# Patient Record
Sex: Female | Born: 1965 | Race: White | Hispanic: No | Marital: Married | Smoking: Never smoker
Health system: Southern US, Community
[De-identification: ages and names within clinical notes are randomized; demographics above are authoritative.]

## PROBLEM LIST (undated history)

## (undated) DIAGNOSIS — S92309A Fracture of unspecified metatarsal bone(s), unspecified foot, initial encounter for closed fracture: Secondary | ICD-10-CM

## (undated) DIAGNOSIS — E785 Hyperlipidemia, unspecified: Secondary | ICD-10-CM

## (undated) HISTORY — DX: Fracture of unspecified metatarsal bone(s), unspecified foot, initial encounter for closed fracture: S92.309A

## (undated) HISTORY — DX: Hyperlipidemia, unspecified: E78.5

## (undated) HISTORY — PX: TONSILLECTOMY: SUR1361

---

## 1986-04-15 HISTORY — PX: OOPHORECTOMY: SHX86

## 1995-08-04 ENCOUNTER — Encounter: Payer: Self-pay | Admitting: Family Medicine

## 1995-08-04 LAB — CONVERTED CEMR LAB: Pap Smear: NORMAL

## 1996-07-09 ENCOUNTER — Encounter: Payer: Self-pay | Admitting: Family Medicine

## 1996-07-09 LAB — CONVERTED CEMR LAB: Pap Smear: NORMAL

## 1997-09-23 ENCOUNTER — Encounter: Payer: Self-pay | Admitting: Family Medicine

## 1998-05-17 ENCOUNTER — Encounter: Payer: Self-pay | Admitting: Family Medicine

## 1998-05-24 ENCOUNTER — Other Ambulatory Visit: Admission: RE | Admit: 1998-05-24 | Discharge: 1998-05-24 | Payer: Self-pay | Admitting: Family Medicine

## 1998-05-25 ENCOUNTER — Encounter: Payer: Self-pay | Admitting: Family Medicine

## 1998-05-25 LAB — CONVERTED CEMR LAB: Blood Glucose, Fasting: 86 mg/dL

## 1999-09-11 ENCOUNTER — Encounter: Payer: Self-pay | Admitting: Family Medicine

## 1999-09-11 ENCOUNTER — Other Ambulatory Visit: Admission: RE | Admit: 1999-09-11 | Discharge: 1999-09-11 | Payer: Self-pay | Admitting: Family Medicine

## 1999-09-11 LAB — CONVERTED CEMR LAB: Pap Smear: NORMAL

## 1999-09-13 ENCOUNTER — Encounter: Payer: Self-pay | Admitting: Family Medicine

## 2000-08-22 ENCOUNTER — Other Ambulatory Visit: Admission: RE | Admit: 2000-08-22 | Discharge: 2000-08-22 | Payer: Self-pay | Admitting: Family Medicine

## 2000-08-27 ENCOUNTER — Encounter: Payer: Self-pay | Admitting: Family Medicine

## 2000-09-01 ENCOUNTER — Encounter: Payer: Self-pay | Admitting: Family Medicine

## 2001-10-28 ENCOUNTER — Encounter: Payer: Self-pay | Admitting: Family Medicine

## 2001-11-02 ENCOUNTER — Encounter: Payer: Self-pay | Admitting: Family Medicine

## 2002-01-14 HISTORY — PX: CHOLECYSTECTOMY: SHX55

## 2002-11-03 ENCOUNTER — Encounter: Payer: Self-pay | Admitting: Family Medicine

## 2002-11-03 LAB — CONVERTED CEMR LAB
Blood Glucose, Fasting: 91 mg/dL
RBC count: 4.47 10*6/uL
WBC, blood: 5.1 10*3/uL

## 2002-11-04 ENCOUNTER — Encounter: Payer: Self-pay | Admitting: Family Medicine

## 2002-11-04 ENCOUNTER — Other Ambulatory Visit: Admission: RE | Admit: 2002-11-04 | Discharge: 2002-11-04 | Payer: Self-pay | Admitting: Family Medicine

## 2003-08-30 ENCOUNTER — Other Ambulatory Visit: Admission: RE | Admit: 2003-08-30 | Discharge: 2003-08-30 | Payer: Self-pay | Admitting: Family Medicine

## 2003-09-01 ENCOUNTER — Encounter: Payer: Self-pay | Admitting: Family Medicine

## 2003-09-01 LAB — CONVERTED CEMR LAB
Blood Glucose, Fasting: 92 mg/dL
TSH: 0.77 microintl units/mL

## 2003-09-02 ENCOUNTER — Encounter: Payer: Self-pay | Admitting: Family Medicine

## 2003-09-02 LAB — CONVERTED CEMR LAB: Pap Smear: NORMAL

## 2004-10-03 ENCOUNTER — Encounter: Payer: Self-pay | Admitting: Family Medicine

## 2004-10-05 ENCOUNTER — Encounter: Payer: Self-pay | Admitting: Family Medicine

## 2004-10-05 ENCOUNTER — Other Ambulatory Visit: Admission: RE | Admit: 2004-10-05 | Discharge: 2004-10-05 | Payer: Self-pay | Admitting: Family Medicine

## 2004-10-05 LAB — CONVERTED CEMR LAB: Pap Smear: NORMAL

## 2004-10-09 ENCOUNTER — Ambulatory Visit: Payer: Self-pay | Admitting: Family Medicine

## 2004-10-25 ENCOUNTER — Ambulatory Visit: Payer: Self-pay | Admitting: Family Medicine

## 2004-11-05 ENCOUNTER — Ambulatory Visit: Payer: Self-pay | Admitting: Family Medicine

## 2005-01-28 ENCOUNTER — Ambulatory Visit: Payer: Self-pay | Admitting: Family Medicine

## 2005-08-21 ENCOUNTER — Ambulatory Visit: Payer: Self-pay | Admitting: Family Medicine

## 2005-10-31 ENCOUNTER — Ambulatory Visit: Payer: Self-pay | Admitting: Family Medicine

## 2005-10-31 LAB — CONVERTED CEMR LAB: Blood Glucose, Fasting: 92 mg/dL

## 2005-11-05 ENCOUNTER — Ambulatory Visit: Payer: Self-pay | Admitting: Family Medicine

## 2005-11-05 ENCOUNTER — Encounter: Payer: Self-pay | Admitting: Family Medicine

## 2005-11-05 ENCOUNTER — Other Ambulatory Visit: Admission: RE | Admit: 2005-11-05 | Discharge: 2005-11-05 | Payer: Self-pay | Admitting: Family Medicine

## 2005-11-05 LAB — CONVERTED CEMR LAB: Pap Smear: NORMAL

## 2006-07-08 ENCOUNTER — Ambulatory Visit: Payer: Self-pay | Admitting: Family Medicine

## 2006-09-02 ENCOUNTER — Ambulatory Visit: Payer: Self-pay | Admitting: Family Medicine

## 2006-11-26 ENCOUNTER — Ambulatory Visit: Payer: Self-pay | Admitting: Family Medicine

## 2006-12-06 ENCOUNTER — Ambulatory Visit: Payer: Self-pay | Admitting: Internal Medicine

## 2006-12-12 ENCOUNTER — Ambulatory Visit: Payer: Self-pay | Admitting: Family Medicine

## 2006-12-12 LAB — CONVERTED CEMR LAB: Blood Glucose, Fasting: 86 mg/dL

## 2007-01-09 ENCOUNTER — Other Ambulatory Visit: Admission: RE | Admit: 2007-01-09 | Discharge: 2007-01-09 | Payer: Self-pay | Admitting: Family Medicine

## 2007-01-09 ENCOUNTER — Ambulatory Visit: Payer: Self-pay | Admitting: Family Medicine

## 2007-01-09 ENCOUNTER — Encounter: Payer: Self-pay | Admitting: Family Medicine

## 2007-01-09 LAB — CONVERTED CEMR LAB: Pap Smear: NORMAL

## 2007-01-14 ENCOUNTER — Ambulatory Visit: Payer: Self-pay | Admitting: Family Medicine

## 2007-02-09 ENCOUNTER — Ambulatory Visit: Payer: Self-pay | Admitting: Family Medicine

## 2007-02-09 LAB — CONVERTED CEMR LAB
Albumin: 3.9 g/dL (ref 3.5–5.2)
Bilirubin, Direct: 0.1 mg/dL (ref 0.0–0.3)
Total Bilirubin: 0.7 mg/dL (ref 0.3–1.2)
Total Protein: 7.2 g/dL (ref 6.0–8.3)

## 2007-03-10 ENCOUNTER — Ambulatory Visit: Payer: Self-pay | Admitting: Family Medicine

## 2007-12-29 ENCOUNTER — Encounter: Payer: Self-pay | Admitting: Family Medicine

## 2007-12-29 DIAGNOSIS — G43009 Migraine without aura, not intractable, without status migrainosus: Secondary | ICD-10-CM | POA: Insufficient documentation

## 2007-12-29 DIAGNOSIS — E785 Hyperlipidemia, unspecified: Secondary | ICD-10-CM | POA: Insufficient documentation

## 2007-12-29 DIAGNOSIS — J309 Allergic rhinitis, unspecified: Secondary | ICD-10-CM | POA: Insufficient documentation

## 2008-01-11 ENCOUNTER — Ambulatory Visit: Payer: Self-pay | Admitting: Family Medicine

## 2008-01-11 LAB — CONVERTED CEMR LAB
BUN: 11 mg/dL (ref 6–23)
Basophils Absolute: 0 10*3/uL (ref 0.0–0.1)
Cholesterol: 204 mg/dL (ref 0–200)
Creatinine, Ser: 0.8 mg/dL (ref 0.4–1.2)
Direct LDL: 111.5 mg/dL
Eosinophils Absolute: 0.1 10*3/uL (ref 0.0–0.6)
GFR calc non Af Amer: 84 mL/min
MCHC: 33.7 g/dL (ref 30.0–36.0)
MCV: 91.5 fL (ref 78.0–100.0)
Monocytes Absolute: 0.3 10*3/uL (ref 0.2–0.7)
Monocytes Relative: 6.6 % (ref 3.0–11.0)
Potassium: 4 meq/L (ref 3.5–5.1)
RBC: 4.15 M/uL (ref 3.87–5.11)
RDW: 11.7 % (ref 11.5–14.6)
Sodium: 137 meq/L (ref 135–145)
TSH: 0.55 microintl units/mL (ref 0.35–5.50)
Total CHOL/HDL Ratio: 2.5
Triglycerides: 50 mg/dL (ref 0–149)

## 2008-01-14 ENCOUNTER — Ambulatory Visit: Payer: Self-pay | Admitting: Family Medicine

## 2008-01-14 ENCOUNTER — Other Ambulatory Visit: Admission: RE | Admit: 2008-01-14 | Discharge: 2008-01-14 | Payer: Self-pay | Admitting: Family Medicine

## 2008-01-14 ENCOUNTER — Encounter: Payer: Self-pay | Admitting: Family Medicine

## 2008-01-18 ENCOUNTER — Encounter (INDEPENDENT_AMBULATORY_CARE_PROVIDER_SITE_OTHER): Payer: Self-pay | Admitting: *Deleted

## 2008-02-16 ENCOUNTER — Ambulatory Visit: Payer: Self-pay | Admitting: Family Medicine

## 2008-02-16 ENCOUNTER — Encounter: Payer: Self-pay | Admitting: Family Medicine

## 2008-02-17 ENCOUNTER — Encounter (INDEPENDENT_AMBULATORY_CARE_PROVIDER_SITE_OTHER): Payer: Self-pay | Admitting: *Deleted

## 2008-02-18 ENCOUNTER — Telehealth: Payer: Self-pay | Admitting: Family Medicine

## 2008-03-21 ENCOUNTER — Ambulatory Visit: Payer: Self-pay | Admitting: Family Medicine

## 2008-08-19 ENCOUNTER — Telehealth: Payer: Self-pay | Admitting: Family Medicine

## 2008-10-20 ENCOUNTER — Ambulatory Visit: Payer: Self-pay | Admitting: Family Medicine

## 2008-12-28 ENCOUNTER — Telehealth: Payer: Self-pay | Admitting: Family Medicine

## 2009-01-25 ENCOUNTER — Ambulatory Visit: Payer: Self-pay | Admitting: Family Medicine

## 2009-01-25 LAB — CONVERTED CEMR LAB
Albumin: 4.1 g/dL (ref 3.5–5.2)
Alkaline Phosphatase: 35 units/L — ABNORMAL LOW (ref 39–117)
BUN: 12 mg/dL (ref 6–23)
Basophils Relative: 0.2 % (ref 0.0–3.0)
Calcium: 9.6 mg/dL (ref 8.4–10.5)
Creatinine, Ser: 0.8 mg/dL (ref 0.4–1.2)
Eosinophils Absolute: 0.1 10*3/uL (ref 0.0–0.7)
Eosinophils Relative: 2.9 % (ref 0.0–5.0)
GFR calc Af Amer: 101 mL/min
GFR calc non Af Amer: 84 mL/min
Glucose, Bld: 92 mg/dL (ref 70–99)
HCT: 39.4 % (ref 36.0–46.0)
HDL: 93.9 mg/dL (ref >39.0)
Hemoglobin: 13.7 g/dL (ref 12.0–15.0)
MCV: 92.5 fL (ref 78.0–100.0)
Monocytes Absolute: 0.3 10*3/uL (ref 0.1–1.0)
Monocytes Relative: 5.5 % (ref 3.0–12.0)
Neutro Abs: 3 10*3/uL (ref 1.4–7.7)
Platelets: 258 10*3/uL (ref 150–400)
Potassium: 3.9 meq/L (ref 3.5–5.1)
RBC: 4.26 M/uL (ref 3.87–5.11)
TSH: 1.81 microintl units/mL (ref 0.35–5.50)
Total CHOL/HDL Ratio: 2.1
Total Protein: 7.3 g/dL (ref 6.0–8.3)
WBC: 5 10*3/uL (ref 4.5–10.5)

## 2009-01-31 ENCOUNTER — Encounter: Payer: Self-pay | Admitting: Family Medicine

## 2009-01-31 ENCOUNTER — Other Ambulatory Visit: Admission: RE | Admit: 2009-01-31 | Discharge: 2009-01-31 | Payer: Self-pay | Admitting: Family Medicine

## 2009-01-31 ENCOUNTER — Ambulatory Visit: Payer: Self-pay | Admitting: Family Medicine

## 2009-02-03 ENCOUNTER — Encounter (INDEPENDENT_AMBULATORY_CARE_PROVIDER_SITE_OTHER): Payer: Self-pay | Admitting: *Deleted

## 2009-03-01 ENCOUNTER — Ambulatory Visit: Payer: Self-pay | Admitting: Family Medicine

## 2009-03-01 ENCOUNTER — Encounter: Payer: Self-pay | Admitting: Family Medicine

## 2009-03-02 ENCOUNTER — Encounter (INDEPENDENT_AMBULATORY_CARE_PROVIDER_SITE_OTHER): Payer: Self-pay | Admitting: *Deleted

## 2009-04-24 ENCOUNTER — Ambulatory Visit: Payer: Self-pay | Admitting: Family Medicine

## 2010-01-26 ENCOUNTER — Ambulatory Visit: Payer: Self-pay | Admitting: Family Medicine

## 2010-01-27 LAB — CONVERTED CEMR LAB
Albumin: 4 g/dL (ref 3.5–5.2)
Basophils Absolute: 0 10*3/uL (ref 0.0–0.1)
Basophils Relative: 0.8 % (ref 0.0–3.0)
CO2: 26 meq/L (ref 19–32)
Chloride: 107 meq/L (ref 96–112)
Creatinine, Ser: 0.8 mg/dL (ref 0.4–1.2)
Eosinophils Absolute: 0.1 10*3/uL (ref 0.0–0.7)
Glucose, Bld: 87 mg/dL (ref 70–99)
HDL: 92.9 mg/dL (ref >39.00)
Hemoglobin: 12.7 g/dL (ref 12.0–15.0)
MCHC: 33.2 g/dL (ref 30.0–36.0)
MCV: 93.7 fL (ref 78.0–100.0)
Monocytes Absolute: 0.3 10*3/uL (ref 0.1–1.0)
Neutro Abs: 3.3 10*3/uL (ref 1.4–7.7)
Neutrophils Relative %: 64.1 % (ref 43.0–77.0)
RBC: 4.09 M/uL (ref 3.87–5.11)
RDW: 12.1 % (ref 11.5–14.6)
TSH: 1.34 microintl units/mL (ref 0.35–5.50)
Total CHOL/HDL Ratio: 2
Total Protein: 6.6 g/dL (ref 6.0–8.3)
Triglycerides: 56 mg/dL (ref 0.0–149.0)

## 2010-02-06 ENCOUNTER — Other Ambulatory Visit: Admission: RE | Admit: 2010-02-06 | Discharge: 2010-02-06 | Payer: Self-pay | Admitting: Family Medicine

## 2010-02-06 ENCOUNTER — Ambulatory Visit: Payer: Self-pay | Admitting: Family Medicine

## 2010-02-06 LAB — CONVERTED CEMR LAB: Pap Smear: NORMAL

## 2010-02-13 ENCOUNTER — Encounter (INDEPENDENT_AMBULATORY_CARE_PROVIDER_SITE_OTHER): Payer: Self-pay | Admitting: *Deleted

## 2010-02-13 LAB — CONVERTED CEMR LAB: Pap Smear: NEGATIVE

## 2010-03-05 ENCOUNTER — Telehealth: Payer: Self-pay | Admitting: Family Medicine

## 2010-03-08 ENCOUNTER — Ambulatory Visit: Payer: Self-pay | Admitting: Family Medicine

## 2010-03-08 ENCOUNTER — Encounter: Payer: Self-pay | Admitting: Family Medicine

## 2010-03-27 ENCOUNTER — Ambulatory Visit: Payer: Self-pay | Admitting: Family Medicine

## 2010-04-04 ENCOUNTER — Ambulatory Visit: Payer: Self-pay | Admitting: Family Medicine

## 2010-04-04 ENCOUNTER — Encounter: Payer: Self-pay | Admitting: Family Medicine

## 2010-04-04 LAB — HM MAMMOGRAPHY

## 2010-04-05 ENCOUNTER — Encounter (INDEPENDENT_AMBULATORY_CARE_PROVIDER_SITE_OTHER): Payer: Self-pay | Admitting: *Deleted

## 2010-04-30 ENCOUNTER — Ambulatory Visit: Payer: Self-pay | Admitting: Family Medicine

## 2010-07-23 ENCOUNTER — Encounter (INDEPENDENT_AMBULATORY_CARE_PROVIDER_SITE_OTHER): Payer: Self-pay | Admitting: *Deleted

## 2010-12-21 ENCOUNTER — Encounter: Payer: Self-pay | Admitting: Family Medicine

## 2010-12-21 ENCOUNTER — Ambulatory Visit
Admission: RE | Admit: 2010-12-21 | Discharge: 2010-12-21 | Payer: Self-pay | Source: Home / Self Care | Attending: Family Medicine | Admitting: Family Medicine

## 2010-12-21 LAB — CONVERTED CEMR LAB

## 2011-01-15 NOTE — Assessment & Plan Note (Signed)
Summary: TOE/CLE   Vital Signs:  Patient profile:   45 year old female Weight:      131.50 pounds Temp:     98.8 degrees F oral Pulse rate:   64 / minute Pulse rhythm:   regular BP sitting:   98 / 60  (left arm) Cuff size:   regular  Vitals Entered By: Sydell Axon LPN (March 27, 2010 12:36 PM) CC: ? infected big right toe, red and swollen   History of Present Illness: Pt here in followup from 3/24 when seen for right great toe inflammation felt to be infection. She was treated with holes to the nail to relieve pressure, and incisional drainage of the paronychial area. The discomfort dhe had was somewhat relieved and recurred slightly a week ago andf is now again resolved but the toe is still slightly swollen and the nail is echymotice proximally.  She also has swelling and idscharge of the right eye late Sat/early Sun with reasonable resiolution after cleansing the eye and starting an antihistamine.  Problems Prior to Update: 1)  Paronychia, Great Toe  (ICD-681.11) 2)  Pain in Soft Tissues of Limb  (ICD-729.5) 3)  Health Maintenance Exam  (ICD-V70.0) 4)  Other Screening Mammogram  (ICD-V76.12) 5)  Migraine, Classical  (ICD-346.00) 6)  Hyperlipidemia  (ICD-272.4) 7)  Allergic Rhinitis  (ICD-477.9)  Medications Prior to Update: 1)  Desogen 0.15-30 Mg-Mcg Tabs (Desogestrel-Ethinyl Estradiol) .... Take As Directed 2)  Zomig Zmt 5 Mg Tbdp (Zolmitriptan) .... As Directed At Onset of  Migraine 3)  Topamax 100 Mg Tabs (Topiramate) .Marland Kitchen.. 1 Tablet Each Am By Mouth 4)  Gabapentin 600 Mg Tabs (Gabapentin) .Marland Kitchen.. 1 Tablet At Bedtime By Mouth 5)  Multivitamins   Tabs (Multiple Vitamin) .... One Daily 6)  Calcium Carbonate-Vitamin D 600-400 Mg-Unit  Tabs (Calcium Carbonate-Vitamin D) .... One Daily 7)  Vitamin C 500 Mg  Tabs (Ascorbic Acid) .... One Twice Daily 8)  Fish Oil   Oil (Fish Oil) .... One Daily 9)  Stool Softener .... One Daily 10)  Levaquin 750 Mg Tabs (Levofloxacin) .Marland Kitchen.. 1 By Mouth  Daily  Allergies: 1)  ! Penicillin 2)  ! Keflex 3)  ! Sulfa 4)  ! Macrodantin 5)  ! Erythromycin  Physical Exam  General:  GEN: Well-developed,well-nourished,in no acute distress; alert,appropriate and cooperative throughout examination HEENT: Normocephalic and atraumatic without obvious abnormalities. No apparent alopecia or balding. Ears, externally no deformities PULM: Breathing comfortably in no respiratory distress EXT: No clubbing, cyanosis, or edema PSYCH: Normally interactive. Cooperative during the interview. Pleasant. Friendly and conversant. Not anxious or depressed appearing. Normal, full affect.  Head:  Normocephalic and atraumatic without obvious abnormalities. No apparent alopecia or balding. Eyes:  Conjunctiva reasonably clear bilaterally, min inflamm of palpebral conjunctiva dependently.  Ears:  External ear exam shows no significant lesions or deformities.  Otoscopic examination reveals clear canals, tympanic membranes are intact bilaterally without bulging, retraction, inflammation or discharge. Hearing is grossly normal bilaterally. Nose:  External nasal examination shows no deformity or inflammation. Nasal mucosa are pink and moist without lesions or exudates. Mouth:  Oral mucosa and oropharynx without lesions or exudates.  Teeth in good repair. Neck:  No deformities, masses, or tenderness noted. Lungs:  Normal respiratory effort, chest expands symmetrically. Lungs are clear to auscultation, no crackles or wheezes. Heart:  Normal rate and regular rhythm. S1 and S2 normal without gallop, murmur, click, rub or other extra sounds. Extremities:  No clubbing, cyanosis, edema, or deformity noted with normal  full range of motion of all joints.  Right gt toe with slight discoloration of the prox nailbed and two small holes in the prox nail. Paronychial area minimally tender and intact. Slight redness and swelling of the toe int he DIP area generally but nontender and not  warm. Skin:  Intact without suspicious lesions or rashes   Impression & Recommendations:  Problem # 1:  PARONYCHIA, GREAT TOE (ICD-681.11) Assessment Improved Think most of the abnormal appearance vs the left toe from past inflammation not yet resolved and don't think it will resolve for quite a while. Reassurance given. Warned her to have patience and soak frequently in hot water to stimulate circulation. RTC if redness, swelling or pain significantly worsens. The following medications were removed from the medication list:    Levaquin 750 Mg Tabs (Levofloxacin) .Marland Kitchen... 1 by mouth daily  Problem # 2:  CONJUNCTIVITIS NOS (ICD-372.30) Assessment: New Sxs c/w viral conjunctivitis. Discussed eye hygiene. May contyinue Antihistamine. RTC if worsens.  Complete Medication List: 1)  Desogen 0.15-30 Mg-mcg Tabs (Desogestrel-ethinyl estradiol) .... Take as directed 2)  Zomig Zmt 5 Mg Tbdp (Zolmitriptan) .... As directed at onset of  migraine 3)  Topamax 100 Mg Tabs (Topiramate) .Marland Kitchen.. 1 tablet each am by mouth 4)  Gabapentin 600 Mg Tabs (Gabapentin) .Marland Kitchen.. 1 tablet at bedtime by mouth 5)  Multivitamins Tabs (Multiple vitamin) .... One daily 6)  Calcium Carbonate-vitamin D 600-400 Mg-unit Tabs (Calcium carbonate-vitamin d) .... One daily 7)  Vitamin C 500 Mg Tabs (Ascorbic acid) .... One twice daily 8)  Fish Oil Oil (Fish oil) .... One daily 9)  Stool Softener  .... One daily  Patient Instructions: 1)  RTC if sxs worsen.  Current Allergies (reviewed today): ! PENICILLIN ! KEFLEX ! SULFA ! MACRODANTIN ! ERYTHROMYCIN

## 2011-01-15 NOTE — Letter (Signed)
Summary: Results Follow up Letter   at [facility]  [address] [place], [place] [address]   Phone: [phone]  Fax: [phone]    02/13/2010 MRN: [id number]  Margaret Brewer [address] [place], [place]  [address]  Dear Ms. Hoefle,  The following are the results of your recent test(s):  Test         Result    Pap Smear:        Normal __X___  Not Normal _____ Comments: Please repeat in one year. ______________________________________________________ Cholesterol: LDL(Bad cholesterol):         Your goal is less than:         HDL (Good cholesterol):       Your goal is more than: Comments:  ______________________________________________________ Mammogram:        Normal _____  Not Normal _____ Comments:  ___________________________________________________________________ Hemoccult:        Normal _____  Not normal _______ Comments:    _____________________________________________________________________ Other Tests:    We routinely do not discuss normal results over the telephone.  If you desire a copy of the results, or you have any questions about this information we can discuss them at your next office visit.   Sincerely,      Laurita Quint, MD

## 2011-01-15 NOTE — Miscellaneous (Signed)
Summary: Special Procedure Consent/[place] [facility]  Special Procedure Consent/[place] [facility]   Imported By: Lanelle Bal 03/13/2010 13:39:14  _____________________________________________________________________  External Attachment:    Type:   Image     Comment:   External Document

## 2011-01-15 NOTE — Assessment & Plan Note (Signed)
Summary: CPX/CLE   Vital Signs:  Patient profile:   45 year old female Height:      62.5 inches Weight:      133 pounds BMI:     24.02 Temp:     98.6 degrees F oral Pulse rate:   72 / minute Pulse rhythm:   regular BP sitting:   100 / 70  (left arm) Cuff size:   regular  Vitals Entered By: Sydell Axon LPN (February 06, 2010 9:17 AM) CC: 30 Minute checkup, needs pap   History of Present Illness: Pt here for Comp Exam, doing well without complaint.  Preventive Screening-Counseling & Management  Alcohol-Tobacco     Alcohol drinks/day: 1     Alcohol type: wine     Smoking Status: never     Passive Smoke Exposure: no  Caffeine-Diet-Exercise     Caffeine use/day: 2     Does Patient Exercise: yes     Type of exercise: runs     Times/week: 5  Problems Prior to Update: 1)  Health Maintenance Exam  (ICD-V70.0) 2)  Other Screening Mammogram  (ICD-V76.12) 3)  Migraine, Classical  (ICD-346.00) 4)  Hyperlipidemia  (ICD-272.4) 5)  Allergic Rhinitis  (ICD-477.9)  Medications Prior to Update: 1)  Desogen 0.15-30 Mg-Mcg Tabs (Desogestrel-Ethinyl Estradiol) .... Take As Directed 2)  Zomig Zmt 5 Mg Tbdp (Zolmitriptan) .... As Directed At Onset of  Migraine 3)  Topamax 100 Mg Tabs (Topiramate) .Marland Kitchen.. 1 Tablet Each Am By Mouth 4)  Gabapentin 600 Mg Tabs (Gabapentin) .Marland Kitchen.. 1 Tablet At Bedtime By Mouth 5)  Multivitamins   Tabs (Multiple Vitamin) .... One Daily 6)  Calcium Carbonate-Vitamin D 600-400 Mg-Unit  Tabs (Calcium Carbonate-Vitamin D) .... One Daily 7)  Vitamin C 500 Mg  Tabs (Ascorbic Acid) .... One Twice Daily 8)  Fish Oil   Oil (Fish Oil) .... One Daily 9)  Stool Softener .... One Daily 10)  Tussionex Pennkinetic Er 8-10 Mg/4ml Lqcr (Chlorpheniramine-Hydrocodone) .Marland Kitchen.. 1 Tsp At Bedtime As Needed Cough  Allergies: 1)  ! Penicillin 2)  ! Keflex 3)  ! Sulfa 4)  ! Macrodantin 5)  ! Erythromycin  Past History:  Past Medical History: Last updated: 12/29/2007 Allergic  rhinitis:(09/1997) Hyperlipidemia:(05/1998)  Past Surgical History: Last updated: 01/14/2008 TONSILLECTOMY AS CHILD OOPHORECTOMY FOR OVARIAN CYST 5/87 LAPAROSCOPIC CHOLEYCYST(DR. 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 Eyes:  Denies blurring, discharge, double vision, eye irritation, and eye pain. ENT:  Denies decreased hearing, ear discharge, earache, and ringing in ears. CV:  Denies chest pain or discomfort, fainting, fatigue, palpitations, shortness of  breath with exertion, swelling of feet, and swelling of hands. Resp:  Denies chest pain with inspiration, cough, shortness of breath, and wheezing. GI:  Denies abdominal pain, bloody stools, change in bowel habits, constipation, dark tarry stools, diarrhea, indigestion, loss of appetite, nausea, vomiting, vomiting blood, and yellowish skin color. GU:  Denies discharge, dysuria, nocturia, and urinary frequency. MS:  Denies  joint pain, low back pain, muscle aches, cramps, muscle weakness, and stiffness. Derm:  Denies dryness, itching, and rash. Neuro:  Denies memory loss, numbness, poor balance, tingling, and tremors.  Physical Exam  General:  alert, well-developed, well-nourished, and well-hydrated.   Head:  Normocephalic and atraumatic without obvious abnormalities. No apparent alopecia or balding. Eyes:  Conjunctiva clear bilaterally.  Ears:  External ear exam shows no significant lesions or deformities.  Otoscopic examination reveals clear canals, tympanic membranes are intact bilaterally without bulging, retraction, inflammation or discharge. Hearing is grossly normal bilaterally. Nose:  External nasal examination shows no deformity or inflammation. Nasal mucosa are pink and moist without lesions or exudates. Mouth:  Oral mucosa and oropharynx without lesions or exudates.  Teeth in good repair. Neck:  No deformities, masses, or tenderness noted. Chest Wall:  No deformities, masses, or tenderness noted. Breasts:  No mass, nodules, thickening, tenderness, bulging, retraction, inflamation, nipple discharge or skin changes noted.   Lungs:  Normal respiratory effort, chest expands symmetrically. Lungs are clear to auscultation, no crackles or wheezes. Heart:  Normal rate and regular rhythm. S1 and S2 normal without gallop, murmur, click, rub or other extra sounds. Abdomen:  Bowel sounds positive,abdomen soft and non-tender without masses, organomegaly or hernias noted. Rectal:  No external abnormalities noted. Normal sphincter tone. No rectal masses or tenderness. G neg. Genitalia:  Pelvic Exam:        External: normal female genitalia without lesions or masses        Vagina: normal without lesions or masses        Cervix: normal without lesions or masses        Adnexa: normal bimanual exam without masses or fullness        Uterus: normal by palpation        Pap smear: performed Msk:  No deformity or scoliosis  noted of thoracic or lumbar spine.   Pulses:  R and L carotid,radial,femoral,dorsalis pedis and posterior tibial pulses are full and equal bilaterally Extremities:  No clubbing, cyanosis, edema, or deformity noted with normal full range of motion of all joints.   Neurologic:  No cranial nerve deficits noted. Station and gait are normal. Plantar reflexes are down-going bilaterally. DTRs are symmetrical throughout. Sensory, motor and coordinative functions appear intact. Skin:  Intact without suspicious lesions or rashes Cervical Nodes:  shotty tonsilar nodes, no anterior cervical adenopathy and no posterior cervical adenopathy.   Axillary Nodes:  No palpable lymphadenopathy Inguinal Nodes:  No significant adenopathy Psych:  normally interactive and good eye contact.     Impression & Recommendations:  Problem # 1:  HEALTH MAINTENANCE EXAM (ICD-V70.0) Assessment Comment Only Desogen sent in to [facility].  Problem # 2:  MIGRAINE, CLASSICAL (ICD-346.00) Assessment: Unchanged Stable. Cont meds as needed. Her updated medication list for this problem includes:    Zomig Zmt 5 Mg Tbdp (Zolmitriptan) .Marland Kitchen... As directed at onset of  migraine  Problem # 3:  HYPERLIPIDEMIA (ICD-272.4) Assessment: Unchanged Good control. Cont lifestyle and intake. Labs Reviewed: SGOT: 21 (01/26/2010)   SGPT: 20 (01/26/2010)   HDL:92.90 (01/26/2010), 93.9 (01/25/2009)  LDL:93 (01/26/2010), 79 (  01/25/2009)  Chol:197 (01/26/2010), 196 (01/25/2009)  Trig:56.0 (01/26/2010), 114 (01/25/2009)  Problem # 4:  ALLERGIC RHINITIS (ICD-477.9) Assessment: Unchanged Stable.  Complete Medication List: 1)  Desogen 0.15-30 Mg-mcg Tabs (Desogestrel-ethinyl estradiol) .... Take as directed 2)  Zomig Zmt 5 Mg Tbdp (Zolmitriptan) .... As directed at onset of  migraine 3)  Topamax 100 Mg Tabs (Topiramate) .Marland Kitchen.. 1 tablet each am by mouth 4)  Gabapentin 600 Mg Tabs (Gabapentin) .Marland Kitchen.. 1 tablet at bedtime by mouth 5)  Multivitamins Tabs  (Multiple vitamin) .... One daily 6)  Calcium Carbonate-vitamin D 600-400 Mg-unit Tabs (Calcium carbonate-vitamin d) .... One daily 7)  Vitamin C 500 Mg Tabs (Ascorbic acid) .... One twice daily 8)  Fish Oil Oil (Fish oil) .... One daily 9)  Stool Softener  .... One daily  Patient Instructions: 1)  RTC as needed. Prescriptions: DESOGEN 0.15-30 MG-MCG TABS (DESOGESTREL-ETHINYL ESTRADIOL) TAKE AS DIRECTED  #3 packs x 4   Entered and Authorized by:   Shaune Leeks MD   Signed by:   Shaune Leeks MD on 02/06/2010   Method used:   Electronically to        [facility]* (mail-order)             ,          Ph: [phone]       Fax: [phone]   RxID:   [id number]   Current Allergies (reviewed today): ! PENICILLIN ! KEFLEX ! SULFA ! MACRODANTIN ! ERYTHROMYCIN

## 2011-01-15 NOTE — Progress Notes (Signed)
Summary: needs order for mammogram  Phone Note Call from Patient Call back at Work Phone [phone]   Caller: Patient Summary of Call: Pt needs order for mammogram.  She goes [place].  Prefers early morning appt-  8:30- 8:45.  Knows that you are out till next week. Initial call taken by: Lowella Petties CMA,  March 05, 2010 9:32 AM  Follow-up for Phone Call        [facility] set up at [facility] on 04/04/2010 at 8:30, order faxed and mailed. Follow-up by: Carlton Adam,  March 08, 2010 8:45 AM

## 2011-01-15 NOTE — Assessment & Plan Note (Signed)
Summary: RIGHT FOOT INJURY-NOT SURE WHEN IT HAPPEN  CYD   Vital Signs:  Patient profile:   45 year old female Height:      62.5 inches Weight:      131.0 pounds BMI:     23.66 Temp:     98.4 degrees F oral Pulse rate:   72 / minute Pulse rhythm:   regular BP sitting:   90 / 60  (left arm) Cuff size:   regular  Vitals Entered By: Benny Lennert CMA Duncan Dull) (March 08, 2010 10:02 AM)   History of Present Illness: Chief complaint Right great toe injury not sure when this happened  45 year old:  the patient has some mild redness  and swelling in the medial aspect of the distal toe  adjacent to the nail bed. There is also some discoloration in the proximal nailbed medially.  There is tenderness to palpation and around the nailbed and  medial toe.  The patient has no history of trauma. She is able walk with a mildly antalgic gait.  There is no history of significant traumatic fracture, operative interventions in the affected area. She has no known history of gout.  Allergies: 1)  ! Penicillin 2)  ! Keflex 3)  ! Sulfa 4)  ! Macrodantin 5)  ! Erythromycin  Past History:  Past medical, surgical, family and social histories (including risk factors) reviewed, and no changes noted (except as noted below).  Past Medical History: Reviewed history from 12/29/2007 and no changes required. Allergic rhinitis:(09/1997) Hyperlipidemia:(05/1998)  Past Surgical History: Reviewed history from 01/14/2008 and no changes required. TONSILLECTOMY AS CHILD OOPHORECTOMY FOR OVARIAN CYST 5/87 LAPAROSCOPIC CHOLEYCYST(DR. Evette Cristal) 01/14/2002  Family History: Reviewed history from 02/06/2010 and no changes required. Father:ESTRANGED LEFT WHEN SHE WAS 18 MONTHS OLD  Mother: ALIVE 90  OSTEOPROSIS ; ELEVATED CHOLESTEROL; RAD Siblings: NO SIBLINGS CV: + CAD GF HBP: +GF DM: NEGATIVE CANCER: + GM OTHER: + STROKE GF X 2 COLON CANCER: DEPRESSION: GOUT /ARTHRITIS: ETOH/DRUG ABUSE  Social  History: Reviewed history from 12/29/2007 and no changes required. 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 No fever, chills, sweats. No other systemic symptoms. Mostly skeletal pain as noted. There is no pain with motion at the MTP joint.  Physical Exam  General:  GEN: Well-developed,well-nourished,in no acute distress; alert,appropriate and cooperative throughout examination HEENT: Normocephalic and atraumatic without obvious abnormalities. No apparent alopecia or balding. Ears, externally no deformities PULM: Breathing comfortably in no respiratory distress EXT: No clubbing, cyanosis, or edema PSYCH: Normally interactive. Cooperative during the interview. Pleasant. Friendly and conversant. Not anxious or depressed appearing. Normal, full affect.  Msk:  right foot: Nontender throughout the midfoot and hindfoot including all ankle bones.  Nontender throughout all metatarsals and metatarsal  phalangeal joints.   There is some redness and mild swelling  medially and distally on the first  digit.  There is corresponding discoloration and  bruising/brownish appearance to the  proximal nailbed medially. This area does correspond to the point of maximal tenderness.   Impression & Recommendations:  Problem # 1:  PARONYCHIA, GREAT TOE (ICD-681.11) Assessment New  examination most consistent with cellulitis and paronychia. Patient is allergic to  multiple medications, which is difficult in this case.  I advised her to soak her toe in warm  water multiple times a day, and complete antibiotic course.  If she is not improved  by next week, followup is indicated, and I would likely have her see podiatry. I do not think this is gout or other acute musculoskeletal issue.  Incision and drainage,  paronychia,  right great toe patient was verbally consented and  written consent was obtained.  Risks, benefits were discussed. Patient was prepped with alcohol x3, and subsequently a ring block with 5 cc of lidocaine 2% and 5 cc of Marcaine 0.5% was used to achieve excellent anesthesia. area was prepped with Betadine x3.  Then a #11 scalpel was used to incise at the  medial base of the nail bed.. No pus was expressed. It did appear that there was some pus underneath the nailbed in and around the medial base.. subsequently a paper clip was heated  until red hot  to attempt to open the  nail base  and free any trapped pus, however  once this was undertaken, there was no free flowing  pus underneath the nailbed.  Her updated medication list for this problem includes:    Levaquin 750 Mg Tabs (Levofloxacin) .Marland Kitchen... 1 by mouth daily  Orders: I&D Abscess, Simple / Single (10060)  Problem # 2:  PAIN IN SOFT TISSUES OF LIMB (ICD-729.5) x-rays were taken of the great toe Indication pain Findings: There is no evidence for occult fracture dislocation or other significant  abnormality.  Orders: Radiology other (Radiology Other)  Complete Medication List: 1)  Desogen 0.15-30 Mg-mcg Tabs (Desogestrel-ethinyl estradiol) .... Take as directed 2)  Zomig Zmt 5 Mg Tbdp (Zolmitriptan) .... As directed at onset of  migraine 3)  Topamax 100 Mg Tabs (Topiramate) .Marland Kitchen.. 1 tablet each am by mouth 4)  Gabapentin 600 Mg Tabs (Gabapentin) .Marland Kitchen.. 1 tablet at bedtime by mouth 5)  Multivitamins Tabs (Multiple vitamin) .... One daily 6)  Calcium Carbonate-vitamin D 600-400 Mg-unit Tabs (Calcium carbonate-vitamin d) .... One daily 7)  Vitamin C 500 Mg Tabs (Ascorbic acid) .... One twice daily 8)  Fish Oil Oil (Fish oil) .... One daily 9)  Stool Softener  .... One daily 10)  Levaquin 750 Mg Tabs (Levofloxacin) .Marland Kitchen.. 1 by mouth daily Prescriptions: LEVAQUIN 750 MG TABS (LEVOFLOXACIN) 1 by mouth daily  #5 x 0   Entered and Authorized by:   Hannah Beat MD   Signed by:   Hannah Beat MD  on 03/08/2010   Method used:   Electronically to        [facility]* (retail)       [address]       [place], [place]  [address]       Ph: [phone]       Fax: [phone]   RxID:   [id number]   Current Allergies (reviewed today): ! PENICILLIN ! KEFLEX ! SULFA ! MACRODANTIN ! ERYTHROMYCIN

## 2011-01-15 NOTE — Letter (Signed)
Summary: [name] letter  [place] [facility]  [address] [place], [place] [address]   Phone: [phone]  Fax: [phone]       07/23/2010 MRN: [id number]  [name] [address] [place], [place]  [address]  Dear Ms. [facility] [place], and  announce the retirement of Arta Silence, M.D., from full-time practice at the [facility] office effective June 14, 2010 and his plans of returning part-time.  It is important to Dr. Hetty Ely and to our practice that you understand that [facility] has seven physicians in our office for your health care needs.  We will continue to offer the same exceptional care that you have today.    Dr. Hetty Ely has spoken to many of you about his plans for retirement and returning part-time in the fall.   We will continue to work with you through the transition to schedule appointments for you in the office and meet the high standards that [facility] is committed to.   Again, it is with great pleasure that we share the news that Dr. Hetty Ely will return to [facility] in October of 2011 with a reduced schedule.    If you have any questions, or would like to request an appointment with one of our physicians, please call us at [phone] and press the option for Scheduling an appointment.  We take pleasure in providing you with excellent patient care and look forward to seeing you at your next office visit.  Our [facility] Physicians are:  Tillman Abide, M.D. Laurita Quint, M.D. Roxy Manns, M.D. Kerby Nora, M.D. Hannah Beat, M.D. Ruthe Mannan, M.D. We proudly welcomed Raechel Ache, M.D. and Eustaquio Boyden, M.D. to the practice in July/August 2011.  Sincerely,  [facility]

## 2011-01-15 NOTE — Assessment & Plan Note (Signed)
Summary: STILL HAVING PROBLEMS W/BIG TOE ON RIGHT FOOT   Vital Signs:  Patient profile:   45 year old female Weight:      131.75 pounds Temp:     98.6 degrees F oral Pulse rate:   64 / minute Pulse rhythm:   regular BP sitting:   108 / 70  (left arm) Cuff size:   regular  Vitals Entered By: Sydell Axon LPN (Apr 30, 2010 4:00 PM) CC: Still having problems with big toe on right foot   History of Present Illness: Pt here again to discuss her right great toenail which, when she reomoved the polish a week ago, now had a white sepinginous irregular but coalescent area under a significant area of the nail. It is also quite loose distally. She was concerned that something was progressing or that she had developed a fungal  infection.  Problems Prior to Update: 1)  Paronychia, Great Toe  (ICD-681.11) 2)  Pain in Soft Tissues of Limb  (ICD-729.5) 3)  Health Maintenance Exam  (ICD-V70.0) 4)  Other Screening Mammogram  (ICD-V76.12) 5)  Migraine, Classical  (ICD-346.00) 6)  Hyperlipidemia  (ICD-272.4) 7)  Allergic Rhinitis  (ICD-477.9)  Medications Prior to Update: 1)  Desogen 0.15-30 Mg-Mcg Tabs (Desogestrel-Ethinyl Estradiol) .... Take As Directed 2)  Zomig Zmt 5 Mg Tbdp (Zolmitriptan) .... As Directed At Onset of  Migraine 3)  Topamax 100 Mg Tabs (Topiramate) .Marland Kitchen.. 1 Tablet Each Am By Mouth 4)  Gabapentin 600 Mg Tabs (Gabapentin) .Marland Kitchen.. 1 Tablet At Bedtime By Mouth 5)  Multivitamins   Tabs (Multiple Vitamin) .... One Daily 6)  Calcium Carbonate-Vitamin D 600-400 Mg-Unit  Tabs (Calcium Carbonate-Vitamin D) .... One Daily 7)  Vitamin C 500 Mg  Tabs (Ascorbic Acid) .... One Twice Daily 8)  Fish Oil   Oil (Fish Oil) .... One Daily 9)  Stool Softener .... One Daily  Allergies: 1)  ! Penicillin 2)  ! Keflex 3)  ! Sulfa 4)  ! Macrodantin 5)  ! Erythromycin  Physical Exam  Extremities:  R great nail with unattached area under the nail from the naibed which is irregular in shape. Ths nail  itself has thicker appearance slightly distally to the paranychia and the nail at the paranychia itself is adherent and looks nml.   Impression & Recommendations:  Problem # 1:  PARONYCHIA, GREAT TOE (ICD-681.11) Assessment Improved  Infection is akll gone and the inflammation has resolved leaving the potential space under the nail making it now nonadherent and thus white. Explained all to the pt. Will take a year to grow out, if not caught and pulled up. Response is nml from traumatized nail...patience is a virtue.  Complete Medication List: 1)  Desogen 0.15-30 Mg-mcg Tabs (Desogestrel-ethinyl estradiol) .... Take as directed 2)  Zomig Zmt 5 Mg Tbdp (Zolmitriptan) .... As directed at onset of  migraine 3)  Topamax 100 Mg Tabs (Topiramate) .Marland Kitchen.. 1 tablet each am by mouth 4)  Gabapentin 600 Mg Tabs (Gabapentin) .Marland Kitchen.. 1 tablet at bedtime by mouth 5)  Multivitamins Tabs (Multiple vitamin) .... One daily 6)  Calcium Carbonate-vitamin D 600-400 Mg-unit Tabs (Calcium carbonate-vitamin d) .... One daily 7)  Vitamin C 500 Mg Tabs (Ascorbic acid) .... One twice daily 8)  Fish Oil Oil (Fish oil) .... One daily 9)  Stool Softener  .... One daily  Patient Instructions: 1)  Give one year for nail to regenrate normally.  Current Allergies (reviewed today): ! PENICILLIN ! KEFLEX ! SULFA ! MACRODANTIN !  ERYTHROMYCIN

## 2011-01-15 NOTE — Letter (Signed)
Summary: Results Follow up Letter  [place] at [facility]  [address] [place], [place] [address]   Phone: [phone]  Fax: [phone]    04/05/2010 MRN: [id number]  Margaret Brewer [address] [place], [place]  [address]  Dear Ms. Flavell,  The following are the results of your recent test(s):  Test         Result    Pap Smear:        Normal _____  Not Normal _____ Comments: ______________________________________________________ Cholesterol: LDL(Bad cholesterol):         Your goal is less than:         HDL (Good cholesterol):       Your goal is more than: Comments:  ______________________________________________________ Mammogram:        Normal __X___  Not Normal _____ Comments:Please repeat in one year.  ___________________________________________________________________ Hemoccult:        Normal _____  Not normal _______ Comments:    _____________________________________________________________________ Other Tests:    We routinely do not discuss normal results over the telephone.  If you desire a copy of the results, or you have any questions about this information we can discuss them at your next office visit.   Sincerely,     Laurita Quint, MD

## 2011-01-17 NOTE — Assessment & Plan Note (Signed)
Summary: UTI / LFW   Vital Signs:  Patient profile:   45 year old female Height:      62.5 inches Weight:      132.25 pounds BMI:     23.89 Temp:     99.0 degrees F oral Pulse rate:   80 / minute Pulse rhythm:   regular BP sitting:   122 / 74  (left arm) Cuff size:   regular  Vitals Entered By: Selena Batten Dance CMA (AAMA) (December 21, 2010 2:25 PM) CC: ? UTI Comments Pressure, frequency and urgency   History of Present Illness: CC: ? bladder infection  3d h/o incomplete emptying as well as bladder pressure.  increased frequency and urgency.    No dysuria.  No fevers/chills, n/v, back pain.  had utis in past, years ago.  drinking water and cranberry juice.  Current Medications (verified): 1)  Desogen 0.15-30 Mg-Mcg Tabs (Desogestrel-Ethinyl Estradiol) .... Take As Directed 2)  Zomig Zmt 5 Mg Tbdp (Zolmitriptan) .... As Directed At Onset of  Migraine 3)  Topamax 100 Mg Tabs (Topiramate) .Marland Kitchen.. 1 Tablet Each Am By Mouth 4)  Gabapentin 600 Mg Tabs (Gabapentin) .Marland Kitchen.. 1 Tablet At Bedtime By Mouth 5)  Multivitamins   Tabs (Multiple Vitamin) .... One Daily 6)  Calcium Carbonate-Vitamin D 600-400 Mg-Unit  Tabs (Calcium Carbonate-Vitamin D) .... One Daily 7)  Vitamin C 500 Mg  Tabs (Ascorbic Acid) .... One Twice Daily 8)  Fish Oil   Oil (Fish Oil) .... One Daily  Allergies: 1)  ! Penicillin 2)  ! Keflex 3)  ! Sulfa 4)  ! Macrodantin 5)  ! 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 Lungs are clear to auscultation, no crackles or wheezes. Heart:  Normal rate and regular rhythm. S1 and S2 normal without  gallop, murmur, click, rub or other extra sounds. Abdomen:  Bowel sounds positive,abdomen soft and non-tender without masses, organomegaly or hernias noted.  + suprapubic pressure.  no CVA tenderness   Impression & Recommendations:  Problem # 1:  ACUTE CYSTITIS (ICD-595.0)  minimal on micro.  treat with cipro 250 two times a day x 3 days.  sent culture given allergies to other abx ,ensure cipro ok.  Her updated medication list for this problem includes:    Cipro 250 Mg Tabs (Ciprofloxacin hcl) .Marland Kitchen... Take one twice daily for 3 days (use this #)  Orders: UA Dipstick W/ Micro (manual) ([address]) Specimen Handling ([address]) T-Culture, Urine ([id number])  Complete Medication List: 1)  Desogen 0.15-30 Mg-mcg Tabs (Desogestrel-ethinyl estradiol) .... Take as directed 2)  Zomig Zmt 5 Mg Tbdp (Zolmitriptan) .... As directed at onset of  migraine 3)  Topamax 100 Mg Tabs (Topiramate) .Marland Kitchen.. 1 tablet each am by mouth 4)  Gabapentin 600 Mg Tabs (Gabapentin) .Marland Kitchen.. 1 tablet at bedtime by mouth 5)  Multivitamins Tabs (Multiple vitamin) .... One daily 6)  Calcium Carbonate-vitamin D 600-400 Mg-unit Tabs (Calcium carbonate-vitamin d) .... One daily 7)  Vitamin C 500 Mg Tabs (Ascorbic acid) .... One twice daily 8)  Fish Oil Oil (Fish oil) .... One daily 9)  Cipro 250 Mg Tabs (Ciprofloxacin hcl) .... Take one twice daily for 3 days (  use this #)  Patient Instructions: 1)  looks like mild UTI.  treat with cipro 250mg  twice daily for 5 days. 2)  push fluids, plenty of rest. 3)  call clinic with questions or if not any better or any fevers/chills, nausea. 4)  good to see you today. Prescriptions: CIPRO 250 MG TABS (CIPROFLOXACIN HCL) take one twice daily for 3 days (use this #)  #6 x 0   Entered and Authorized by:   Eustaquio Boyden  MD   Signed by:   Eustaquio Boyden  MD on 12/21/2010   Method used:   Electronically to        [facility]* (retail)       [address]       [place], [place]   [address]       Ph: [phone]       Fax: [phone]   RxID:   [id number] CIPRO 250 MG TABS (CIPROFLOXACIN HCL) take one twice daily for 5 days  #10 x 0   Entered and Authorized by:   Eustaquio Boyden  MD   Signed by:   Eustaquio Boyden  MD on 12/21/2010   Method used:   Electronically to        [facility]* (retail)       [address]       [place], [place]  [address]       Ph: [phone]       Fax: [phone]   RxID:   [id number]    Orders Added: 1)  Est. Patient Level III [[address]] 2)  UA Dipstick W/ Micro (manual) [[address]] 3)  Specimen Handling [[address]] 4)  T-Culture, Urine [[id number]]    Current Allergies (reviewed today): ! PENICILLIN ! KEFLEX ! SULFA ! MACRODANTIN ! ERYTHROMYCIN  Laboratory Results   Urine Tests    Urine Microscopic WBC/HPF: 1-5 RBC/HPF: no Bacteria/HPF: 1+ rods Mucous/HPF: no Epithelial/HPF: rare Crystals/HPF: no Casts/LPF: no Yeast/HPF: no    Comments: read by .....................Eustaquio Boyden  MD  December 21, 2010 2:39 PM  UCx sent    Appended Document: UTI / LFW    Clinical Lists Changes  Observations: Added new observation of PH URINE: 7.0  (12/21/2010 14:49) Added new observation of SPEC GR URIN: 1.010  (12/21/2010 14:49) Added new observation of APPEARANCE U: Hazy  (12/21/2010 14:49) Added new observation of UA COLOR: yellow  (12/21/2010 14:49) Added new observation of WBC DIPSTK U: trace  (12/21/2010 14:49) Added new observation of NITRITE URN: negative  (12/21/2010 14:49) Added new observation of UROBILINOGEN: 0.2  (12/21/2010 14:49) Added new observation of PROTEIN, URN: trace  (12/21/2010 14:49) Added new observation of BLOOD UR DIP: negative  (12/21/2010 14:49) Added new observation of KETONES URN: negative  (12/21/2010 14:49) Added new observation of BILIRUBIN UR: negative  (12/21/2010 14:49) Added new observation of GLUCOSE, URN: negative  (12/21/2010 14:49)      Laboratory Results    Urine Tests  Date/Time Received: December 21, 2010  Date/Time Reported: December 21, 2010   Routine Urinalysis   Color: yellow Appearance: Hazy Glucose: negative   (Normal Range: Negative) Bilirubin: negative   (Normal Range: Negative) Ketone: negative   (Normal Range: Negative) Spec. Gravity: 1.010   (Normal Range: 1.003-1.035) Blood: negative   (Normal Range: Negative) pH: 7.0   (Normal Range: 5.0-8.0) Protein: trace   (Normal Range: Negative) Urobilinogen: 0.2   (Normal Range: 0-1) Nitrite: negative   (Normal Range: Negative) Leukocyte Esterace: trace   (Normal Range: Negative)

## 2011-02-04 ENCOUNTER — Encounter (INDEPENDENT_AMBULATORY_CARE_PROVIDER_SITE_OTHER): Payer: Self-pay | Admitting: *Deleted

## 2011-02-04 ENCOUNTER — Other Ambulatory Visit (INDEPENDENT_AMBULATORY_CARE_PROVIDER_SITE_OTHER): Payer: BC Managed Care – PPO

## 2011-02-04 ENCOUNTER — Encounter: Payer: Self-pay | Admitting: Family Medicine

## 2011-02-04 ENCOUNTER — Other Ambulatory Visit: Payer: Self-pay | Admitting: Family Medicine

## 2011-02-04 DIAGNOSIS — E785 Hyperlipidemia, unspecified: Secondary | ICD-10-CM

## 2011-02-04 DIAGNOSIS — M79609 Pain in unspecified limb: Secondary | ICD-10-CM

## 2011-02-04 DIAGNOSIS — Z0000 Encounter for general adult medical examination without abnormal findings: Secondary | ICD-10-CM

## 2011-02-04 LAB — BASIC METABOLIC PANEL
BUN: 15 mg/dL (ref 6–23)
Calcium: 9.8 mg/dL (ref 8.4–10.5)
GFR: 89.14 mL/min (ref >60.00)
Glucose, Bld: 81 mg/dL (ref 70–99)
Sodium: 138 mEq/L (ref 135–145)

## 2011-02-04 LAB — CBC WITH DIFFERENTIAL/PLATELET
Basophils Absolute: 0 10*3/uL (ref 0.0–0.1)
Basophils Relative: 0.6 % (ref 0.0–3.0)
Eosinophils Absolute: 0.1 10*3/uL (ref 0.0–0.7)
Lymphocytes Relative: 39 % (ref 12.0–46.0)
MCHC: 34.7 g/dL (ref 30.0–36.0)
MCV: 92.1 fl (ref 78.0–100.0)
Monocytes Absolute: 0.3 10*3/uL (ref 0.1–1.0)
Neutro Abs: 2.9 10*3/uL (ref 1.4–7.7)
Neutrophils Relative %: 53.1 % (ref 43.0–77.0)
RBC: 4.27 Mil/uL (ref 3.87–5.11)
RDW: 12.8 % (ref 11.5–14.6)

## 2011-02-04 LAB — LDL CHOLESTEROL, DIRECT: Direct LDL: 115 mg/dL

## 2011-02-04 LAB — LIPID PANEL
HDL: 104.3 mg/dL (ref >39.00)
Triglycerides: 71 mg/dL (ref 0.0–149.0)
VLDL: 14.2 mg/dL (ref 0.0–40.0)

## 2011-02-04 LAB — HEPATIC FUNCTION PANEL
Albumin: 4 g/dL (ref 3.5–5.2)
Total Bilirubin: 0.7 mg/dL (ref 0.3–1.2)

## 2011-02-04 LAB — MAGNESIUM: Magnesium: 1.9 mg/dL (ref 1.5–2.5)

## 2011-02-07 ENCOUNTER — Encounter: Payer: Self-pay | Admitting: Family Medicine

## 2011-02-07 ENCOUNTER — Encounter (INDEPENDENT_AMBULATORY_CARE_PROVIDER_SITE_OTHER): Payer: BC Managed Care – PPO | Admitting: Family Medicine

## 2011-02-07 ENCOUNTER — Other Ambulatory Visit (HOSPITAL_COMMUNITY)
Admission: RE | Admit: 2011-02-07 | Discharge: 2011-02-07 | Disposition: A | Discharge status: Home / Self Care | Payer: BC Managed Care – PPO | Source: Ambulatory Visit | Attending: Family Medicine | Admitting: Family Medicine

## 2011-02-07 ENCOUNTER — Other Ambulatory Visit: Payer: Self-pay | Admitting: Family Medicine

## 2011-02-07 DIAGNOSIS — Z0000 Encounter for general adult medical examination without abnormal findings: Secondary | ICD-10-CM

## 2011-02-07 DIAGNOSIS — Z124 Encounter for screening for malignant neoplasm of cervix: Secondary | ICD-10-CM | POA: Insufficient documentation

## 2011-02-12 NOTE — Assessment & Plan Note (Signed)
Summary: cpe CLE   Vital Signs:  Patient profile:   45 year old female Weight:      129.25 pounds Temp:     98.6 degrees F oral Pulse rate:   88 / minute Pulse rhythm:   regular BP sitting:   90 / 60  (left arm) Cuff size:   regular  Vitals Entered By: Sydell Axon LPN (February 07, 2011 8:35 AM) CC: 30 Minute checkup with pap   History of Present Illness: Pt here for Comp Exam. Her nail has just about grown out. She feels well withn no complaints except for migraines after her periods.   Preventive Screening-Counseling & Management  Alcohol-Tobacco     Alcohol drinks/day: 1     Alcohol type: wine     Smoking Status: never     Passive Smoke Exposure: no  Caffeine-Diet-Exercise     Caffeine use/day: 2     Does Patient Exercise: no     Type of exercise: runs     Times/week: 5  Problems Prior to Update: 1)  Paronychia, Great Toe  (ICD-681.11) 2)  Pain in Soft Tissues of Limb  (ICD-729.5) 3)  Health Maintenance Exam  (ICD-V70.0) 4)  Other Screening Mammogram  (ICD-V76.12) 5)  Migraine, Classical  (ICD-346.00) 6)  Hyperlipidemia  (ICD-272.4) 7)  Allergic Rhinitis  (ICD-477.9)  Medications Prior to Update: 1)  Desogen 0.15-30 Mg-Mcg Tabs (Desogestrel-Ethinyl Estradiol) .... Take As Directed 2)  Zomig Zmt 5 Mg Tbdp (Zolmitriptan) .... As Directed At Onset of  Migraine 3)  Topamax 100 Mg Tabs (Topiramate) .Marland Kitchen.. 1 Tablet Each Am By Mouth 4)  Gabapentin 600 Mg Tabs (Gabapentin) .Marland Kitchen.. 1 Tablet At Bedtime By Mouth 5)  Multivitamins   Tabs (Multiple Vitamin) .... One Daily 6)  Calcium Carbonate-Vitamin D 600-400 Mg-Unit  Tabs (Calcium Carbonate-Vitamin D) .... One Daily 7)  Vitamin C 500 Mg  Tabs (Ascorbic Acid) .... One Twice Daily 8)  Fish Oil   Oil (Fish Oil) .... One Daily 9)  Cipro 250 Mg Tabs (Ciprofloxacin Hcl) .... Take One Twice Daily For 3 Days (Use This #)  Current Medications (verified): 1)  Zomig Zmt 5 Mg Tbdp (Zolmitriptan) .... As Directed At Onset of   Migraine 2)  Topamax 100 Mg Tabs (Topiramate) .Marland Kitchen.. 1 Tablet Each Am By Mouth 3)  Gabapentin 600 Mg Tabs (Gabapentin) .Marland Kitchen.. 1 Tablet At Bedtime By Mouth 4)  Multivitamins   Tabs (Multiple Vitamin) .... One Daily 5)  Calcium Carbonate-Vitamin D 600-400 Mg-Unit  Tabs (Calcium Carbonate-Vitamin D) .... One Daily 6)  Vitamin C 500 Mg  Tabs (Ascorbic Acid) .... One Twice Daily 7)  Apri 0.15-30 Mg-Mcg Tabs (Desogestrel-Ethinyl Estradiol) .... Take One By Mouth Daily As Directed 8)  Fish Oil 1000 Mg Caps (Omega-3 Fatty Acids) .... Take One By Mouth Daily  Allergies: 1)  ! Penicillin 2)  ! Keflex 3)  ! Sulfa 4)  ! Macrodantin 5)  ! Erythromycin  Past History:  Past Medical History: Last updated: 12/29/2007 Allergic rhinitis:(09/1997) Hyperlipidemia:(05/1998)  Past Surgical History: Last updated: 01/14/2008 TONSILLECTOMY AS CHILD OOPHORECTOMY FOR OVARIAN CYST 5/87 LAPAROSCOPIC CHOLEYCYST(DR. 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  Risk Factors: Alcohol Use: 1 (02/07/2011) Caffeine Use: 2 (02/07/2011) Exercise: no (02/07/2011)  Risk Factors: Smoking Status: never (02/07/2011) Passive Smoke Exposure: no (02/07/2011)  Family History: Father:ESTRANGED LEFT WHEN SHE WAS 18 MONTHS OLD  Mother: ALIVE 65  OSTEOPROSIS ; ELEVATED CHOLESTEROL; RAD Siblings: NO SIBLINGS CV: + CAD GF HBP: +GF DM: NEGATIVE CANCER: + GM OTHER: + STROKE GF X 2 COLON CANCER: DEPRESSION: GOUT /ARTHRITIS: ETOH/DRUG ABUSE  Social History: Does Patient  Exercise:  no  Review of Systems General:  Denies chills, fatigue, fever, sweats, weakness, and weight loss. Eyes:  Denies blurring, discharge, eye pain, and itching. ENT:  Denies decreased hearing, ear discharge, earache, and ringing in ears. CV:  Denies chest pain or discomfort, fainting, fatigue, palpitations, shortness of breath with exertion, swelling of feet, and swelling of hands. Resp:  Denies cough, shortness of breath, and wheezing. GI:  Denies abdominal pain, bloody stools, change in bowel habits, constipation, dark tarry stools, diarrhea, indigestion, loss of appetite, nausea, vomiting, vomiting blood, and yellowish skin color. GU:  Denies discharge, dysuria, nocturia, and urinary frequency. MS:  Denies joint pain, low back pain, muscle aches, cramps, and stiffness. Derm:  Denies dryness, itching, and rash. Neuro:  Denies numbness, poor balance, tingling, and tremors.  Physical Exam  General:  Well-developed,well-nourished,in no acute distress; alert,appropriate and cooperative throughout examination. Head:  Normocephalic and atraumatic without obvious abnormalities. No apparent alopecia or balding. Sinuses NT. Eyes:  Conjunctiva clear bilaterally.  Ears:  External ear exam shows no significant lesions or deformities.  Otoscopic examination reveals clear canals, tympanic membranes are intact bilaterally without bulging, retraction, inflammation or discharge. Hearing is grossly normal bilaterally. Nose:  External nasal examination shows no deformity or inflammation. Nasal mucosa are pink and moist without lesions or exudates. Mouth:  Oral mucosa and oropharynx without lesions or exudates.  Teeth in good repair. Neck:  No deformities, masses, or tenderness noted. Chest Wall:  No deformities, masses, or tenderness noted. Breasts:  No mass, nodules, thickening, tenderness, bulging, retraction, inflamation, nipple discharge or skin changes noted.   Lungs:  Normal respiratory effort,  chest expands symmetrically. Lungs are clear to auscultation, no crackles or wheezes. Heart:  Normal rate and regular rhythm. S1 and S2 normal without gallop, murmur, click, rub or other extra sounds. Abdomen:  Bowel sounds positive,abdomen soft and non-tender without masses, organomegaly or hernias noted.  + suprapubic pressure.  no CVA tenderness Rectal:  No external abnormalities noted. Normal sphincter tone. No rectal masses or tenderness. G neg. Genitalia:  Pelvic Exam:        External: normal female genitalia without lesions or masses        Vagina: normal without lesions or masses        Cervix: normal without lesions or masses        Adnexa: normal bimanual exam without masses or fullness        Uterus: normal by palpation        Pap smear: performed Msk:  No deformity or scoliosis noted of thoracic or lumbar spine.  Left gt nail almost completely grown out normally. Pulses:  R and L carotid,radial,femoral,dorsalis pedis and posterior tibial pulses are full and equal bilaterally Extremities:  No clubbing, cyanosis, edema, or deformity noted with normal full range of motion of all joints.   Neurologic:  No cranial nerve deficits noted. Station and gait are normal. Plantar reflexes are down-going bilaterally. DTRs are symmetrical throughout.  Sensory, motor and coordinative functions appear intact. Skin:  Intact without suspicious lesions or rashes Cervical Nodes:  shotty tonsilar nodes, no anterior cervical adenopathy and no posterior cervical adenopathy.   Axillary Nodes:  No palpable lymphadenopathy Inguinal Nodes:  No significant adenopathy Psych:  normally interactive and good eye contact.     Impression & Recommendations:  Problem # 1:  HEALTH MAINTENANCE EXAM (ICD-V70.0) Assessment Comment Only  Problem # 2:  OTHER SCREENING MAMMOGRAM (ICD-V76.12) Assessment: Unchanged  UTD, due in Apr.  Orders: Radiology Referral (Radiology)  Problem # 3:  HYPERLIPIDEMIA  (ICD-272.4) Assessment: Unchanged Stable but wouldn't hurt to exercise to decrease LDL more. Labs Reviewed: SGOT: 21 (02/04/2011)   SGPT: 21 (02/04/2011)   HDL:104.30 (02/04/2011), 92.90 (01/26/2010)  LDL:93 (01/26/2010), 79 (01/25/2009)  Chol:229 (02/04/2011), 197 (01/26/2010)  Trig:71.0 (02/04/2011), 56.0 (01/26/2010)  Problem # 4:  MIGRAINE, CLASSICAL (ICD-346.00) Assessment: Unchanged Stable, meds still work. Her updated medication list for this problem includes:    Zomig Zmt 5 Mg Tbdp (Zolmitriptan) .Marland Kitchen... As directed at onset of  migraine  Problem # 5:  ALLERGIC RHINITIS (ICD-477.9) Assessment: Unchanged Stable.  Complete Medication List: 1)  Zomig Zmt 5 Mg Tbdp (Zolmitriptan) .... As directed at onset of  migraine 2)  Topamax 100 Mg Tabs (Topiramate) .Marland Kitchen.. 1 tablet each am by mouth 3)  Gabapentin 600 Mg Tabs (Gabapentin) .Marland Kitchen.. 1 tablet at bedtime by mouth 4)  Multivitamins Tabs (Multiple vitamin) .... One daily 5)  Calcium Carbonate-vitamin D 600-400 Mg-unit Tabs (Calcium carbonate-vitamin d) .... One daily 6)  Vitamin C 500 Mg Tabs (Ascorbic acid) .... One twice daily 7)  Apri 0.15-30 Mg-mcg Tabs (Desogestrel-ethinyl estradiol) .... Take one by mouth daily as directed 8)  Fish Oil 1000 Mg Caps (Omega-3 fatty acids) .... Take one by mouth daily  Patient Instructions: 1)  Refer for Mammo in Apr. 2)  RTC as needed. Prescriptions: APRI 0.15-30 MG-MCG TABS (DESOGESTREL-ETHINYL ESTRADIOL) Take one by mouth daily as directed  #90 x 3   Entered and Authorized by:   Shaune Leeks MD   Signed by:   Shaune Leeks MD on 02/07/2011   Method used:   Faxed to ...       [facility] (mail-order)             , [place]         Ph: [phone]       Fax: [phone]   RxID:   [id number]    Orders Added: 1)  Est. Patient 40-64 years [[address]] 2)  Radiology Referral [Radiology]    Current Allergies (reviewed today): ! PENICILLIN ! KEFLEX ! SULFA ! MACRODANTIN !  ERYTHROMYCIN

## 2011-02-14 ENCOUNTER — Encounter (INDEPENDENT_AMBULATORY_CARE_PROVIDER_SITE_OTHER): Payer: Self-pay | Admitting: *Deleted

## 2011-02-21 NOTE — Letter (Signed)
Summary: Results Follow up Letter  [place] at [facility]  [address] [place], [place] [address]   Phone: [phone]  Fax: [phone]    02/14/2011 MRN: [id number]    Margaret Brewer [address] [place], [place]  [address]  [place]    Dear [name],  The following are the results of your recent test(s):  Test         Result    Pap Smear:        Normal _X__  Not Normal _____ Comments: Please repeat in one year. ______________________________________________________ Cholesterol: LDL(Bad cholesterol):         Your goal is less than:         HDL (Good cholesterol):       Your goal is more than: Comments:  ______________________________________________________ Mammogram:        Normal _____  Not Normal _____ Comments:  ___________________________________________________________________ Hemoccult:        Normal _____  Not normal _______ Comments:    _____________________________________________________________________ Other Tests:    We routinely do not discuss normal results over the telephone.  If you desire a copy of the results, or you have any questions about this information we can discuss them at your next office visit.   Sincerely,    Laurita Quint, MD

## 2011-04-08 ENCOUNTER — Ambulatory Visit: Payer: Self-pay | Admitting: Family Medicine

## 2011-04-18 ENCOUNTER — Encounter: Payer: Self-pay | Admitting: *Deleted

## 2011-04-23 ENCOUNTER — Encounter: Payer: Self-pay | Admitting: Family Medicine

## 2011-05-03 NOTE — Assessment & Plan Note (Signed)
[facility]                                 ON-CALL NOTE   CLAIRESSA, BOULET                 MRN:          [id number]  DATE:12/06/2006                            DOB:          1966-04-13    Phone number [phone].  Patient of Dr. Hetty Ely.  The phone call came at  8:15 a.m.   Ms. Westerfield has been nauseated and she has had some diarrhea as well,  not really substantial fever or pain.  She has taken some Imodium, which  I told her is probably not a great idea.  After discussion we decided  the best thing would be an evaluation in the office, so she is coming in  at 11:45.     Karie Schwalbe, MD  Electronically Signed    RIL/MedQ  DD: 12/06/2006  DT: 12/07/2006  Job #: [id number]   cc:   Arta Silence, MD

## 2012-01-10 ENCOUNTER — Ambulatory Visit (INDEPENDENT_AMBULATORY_CARE_PROVIDER_SITE_OTHER): Payer: BC Managed Care – PPO | Admitting: Family Medicine

## 2012-01-10 ENCOUNTER — Encounter: Payer: Self-pay | Admitting: Family Medicine

## 2012-01-10 VITALS — BP 102/60 | HR 76 | Temp 97.6°F

## 2012-01-10 DIAGNOSIS — R300 Dysuria: Secondary | ICD-10-CM

## 2012-01-10 DIAGNOSIS — N390 Urinary tract infection, site not specified: Secondary | ICD-10-CM | POA: Insufficient documentation

## 2012-01-10 LAB — POCT URINALYSIS DIPSTICK
Blood, UA: NEGATIVE
Protein, UA: NEGATIVE
Spec Grav, UA: <=1.005
Urobilinogen, UA: NEGATIVE

## 2012-01-10 MED ORDER — CIPROFLOXACIN HCL 500 MG PO TABS: 500.0000 mg | ORAL_TABLET | Freq: Every day | ORAL | Status: AC

## 2012-01-10 NOTE — Patient Instructions (Signed)
Drink plenty of water and start the antibiotics today.  We'll contact you with your lab report.  Take care.   

## 2012-01-10 NOTE — Progress Notes (Signed)
H/o UTI. Dysuria: yes, frequency, nocturia, then urgency, pressure, some pain with urination.  Sx intermittent since then.  Today isn't as bad as yesterday AM.  duration of symptoms: 2 days abdominal pain: suprapubic pressure Fevers: no back pain: no Vomiting: no  Meds, vitals, and allergies reviewed.   ROS: See HPI.  Otherwise negative.    GEN: nad, alert and oriented HEENT: mucous membranes moist NECK: supple CV: rrr.  PULM: ctab, no inc wob ABD: soft, +bs, suprapubic area minimally tender EXT: no edema SKIN: no acute rash BACK: no CVA pain  U/a reviewed.   ucx pending.

## 2012-01-12 NOTE — Assessment & Plan Note (Signed)
Cirpo, ucx and fluids, nontoxic.

## 2012-01-29 ENCOUNTER — Other Ambulatory Visit: Payer: Self-pay | Admitting: Family Medicine

## 2012-01-29 DIAGNOSIS — E7800 Pure hypercholesterolemia, unspecified: Secondary | ICD-10-CM

## 2012-02-04 ENCOUNTER — Other Ambulatory Visit (INDEPENDENT_AMBULATORY_CARE_PROVIDER_SITE_OTHER): Payer: BC Managed Care – PPO

## 2012-02-04 DIAGNOSIS — E7800 Pure hypercholesterolemia, unspecified: Secondary | ICD-10-CM

## 2012-02-04 LAB — COMPREHENSIVE METABOLIC PANEL
Alkaline Phosphatase: 35 U/L — ABNORMAL LOW (ref 39–117)
Creatinine, Ser: 0.8 mg/dL (ref 0.4–1.2)
Glucose, Bld: 87 mg/dL (ref 70–99)
Sodium: 137 mEq/L (ref 135–145)
Total Bilirubin: 0.6 mg/dL (ref 0.3–1.2)
Total Protein: 7.1 g/dL (ref 6.0–8.3)

## 2012-02-04 LAB — LDL CHOLESTEROL, DIRECT: Direct LDL: 119.9 mg/dL

## 2012-02-04 LAB — LIPID PANEL
Cholesterol: 225 mg/dL — ABNORMAL HIGH (ref 0–200)
HDL: 91.9 mg/dL (ref >39.00)
Total CHOL/HDL Ratio: 2
Triglycerides: 92 mg/dL (ref 0.0–149.0)

## 2012-02-10 ENCOUNTER — Encounter: Payer: BC Managed Care – PPO | Admitting: Family Medicine

## 2012-02-11 ENCOUNTER — Encounter: Payer: Self-pay | Admitting: Family Medicine

## 2012-02-11 ENCOUNTER — Other Ambulatory Visit (HOSPITAL_COMMUNITY)
Admission: RE | Admit: 2012-02-11 | Discharge: 2012-02-11 | Disposition: A | Discharge status: Home / Self Care | Payer: BC Managed Care – PPO | Source: Ambulatory Visit | Attending: Family Medicine | Admitting: Family Medicine

## 2012-02-11 ENCOUNTER — Ambulatory Visit (INDEPENDENT_AMBULATORY_CARE_PROVIDER_SITE_OTHER): Payer: BC Managed Care – PPO | Admitting: Family Medicine

## 2012-02-11 VITALS — BP 102/68 | HR 80 | Temp 98.5°F | Wt 131.8 lb

## 2012-02-11 DIAGNOSIS — M79603 Pain in arm, unspecified: Secondary | ICD-10-CM

## 2012-02-11 DIAGNOSIS — Z01419 Encounter for gynecological examination (general) (routine) without abnormal findings: Secondary | ICD-10-CM | POA: Insufficient documentation

## 2012-02-11 DIAGNOSIS — E785 Hyperlipidemia, unspecified: Secondary | ICD-10-CM

## 2012-02-11 DIAGNOSIS — Z23 Encounter for immunization: Secondary | ICD-10-CM

## 2012-02-11 DIAGNOSIS — Z0000 Encounter for general adult medical examination without abnormal findings: Secondary | ICD-10-CM

## 2012-02-11 DIAGNOSIS — M79609 Pain in unspecified limb: Secondary | ICD-10-CM

## 2012-02-11 DIAGNOSIS — G43009 Migraine without aura, not intractable, without status migrainosus: Secondary | ICD-10-CM

## 2012-02-11 DIAGNOSIS — Z1231 Encounter for screening mammogram for malignant neoplasm of breast: Secondary | ICD-10-CM

## 2012-02-11 MED ORDER — DESOGESTREL-ETHINYL ESTRADIOL 0.15-30 MG-MCG PO TABS: 1.0000 | ORAL_TABLET | Freq: Every day | ORAL | Status: DC

## 2012-02-11 NOTE — Progress Notes (Signed)
CPE- See plan.  Routine anticipatory guidance given to patient.  See health maintenance.  Mammogram due in 5/13.  Ordered No abnormality on home checks.    Migraine per [facility].  She'll check with them about when to come off OCPs.    R shoulder pain.  R handed.  Intermittent pain.  Some occ numbness in hand.  Occ upper arm soreness.  A lot of mouse work with computer.  Gradually going on since the fall.  It got better when she was on a different mattress and off the computer.    PMH and SH reviewed  Meds, vitals, and allergies reviewed.   ROS: See HPI.  Otherwise negative.    GEN: nad, alert and oriented HEENT: mucous membranes moist NECK: supple w/o LA CV: rrr. PULM: ctab, no inc wob ABD: soft, +bs EXT: no edema SKIN: no acute rash Normal inspection and rom or R shoulder.  No cuff impingement, arm drop.  Distally nv intact Breast exam: No mass, nodules, thickening, tenderness, bulging, retraction, inflamation, nipple discharge or skin changes noted.  No axillary or clavicular LA.  Chaperoned exam.  Normal introitus for age, no external lesions, no vaginal discharge, mucosa pink and moist, no vaginal lesions, cystic cervical changes (prev noted) again noted, more prominent on her right side, no vaginal atrophy, no friaility or hemorrhage, normal uterus size and position, no adnexal masses or tenderness.  Chaperoned exam.  Pap collected.

## 2012-02-11 NOTE — Patient Instructions (Addendum)
I would get a flu shot each fall.   See Shirlee Limerick about your referral before you leave today. Ask Dr. Neale Burly about your birth control pills and the long term plan with that.  I would raise your seat at work.  See if that helps your shoulder and hand.   Take care.   Glad to see you.

## 2012-02-13 ENCOUNTER — Encounter: Payer: Self-pay | Admitting: Family Medicine

## 2012-02-13 DIAGNOSIS — Z0000 Encounter for general adult medical examination without abnormal findings: Secondary | ICD-10-CM | POA: Insufficient documentation

## 2012-02-13 DIAGNOSIS — M79603 Pain in arm, unspecified: Secondary | ICD-10-CM | POA: Insufficient documentation

## 2012-02-13 DIAGNOSIS — M25519 Pain in unspecified shoulder: Secondary | ICD-10-CM | POA: Insufficient documentation

## 2012-02-13 NOTE — Assessment & Plan Note (Signed)
Likely positional, she'll check her workstation and check her posture at work.

## 2012-02-13 NOTE — Assessment & Plan Note (Signed)
Labs d/w pt, high HDL, this isn't a problem.

## 2012-02-13 NOTE — Assessment & Plan Note (Signed)
Pap collected, no abnormals prev.   Mammogram ordered, normal breast exam.  Flu shot encouraged, she has no reason not to get vaccinated.  This was discussed in detail.  Tetanus today.  Healthy habits encouraged.   >25 min spent with face to face with patient, >50% counseling and/or coordinating care, in addition to CPE.

## 2012-02-13 NOTE — Assessment & Plan Note (Addendum)
She'll discuss with [facility].  This discussed in detail.

## 2012-02-14 ENCOUNTER — Encounter: Payer: Self-pay | Admitting: *Deleted

## 2012-04-09 ENCOUNTER — Encounter: Payer: Self-pay | Admitting: Family Medicine

## 2012-04-09 ENCOUNTER — Encounter: Payer: Self-pay | Admitting: *Deleted

## 2012-04-09 ENCOUNTER — Ambulatory Visit: Payer: Self-pay | Admitting: Family Medicine

## 2013-02-03 ENCOUNTER — Other Ambulatory Visit: Payer: Self-pay | Admitting: Family Medicine

## 2013-02-03 DIAGNOSIS — E7800 Pure hypercholesterolemia, unspecified: Secondary | ICD-10-CM

## 2013-02-09 ENCOUNTER — Other Ambulatory Visit: Payer: Self-pay

## 2013-02-09 ENCOUNTER — Other Ambulatory Visit (INDEPENDENT_AMBULATORY_CARE_PROVIDER_SITE_OTHER): Payer: BC Managed Care – PPO

## 2013-02-09 DIAGNOSIS — E7800 Pure hypercholesterolemia, unspecified: Secondary | ICD-10-CM

## 2013-02-09 LAB — BASIC METABOLIC PANEL
BUN: 12 mg/dL (ref 6–23)
Chloride: 104 mEq/L (ref 96–112)
Creatinine, Ser: 0.8 mg/dL (ref 0.4–1.2)
GFR: 85.69 mL/min (ref >60.00)
Potassium: 3.9 mEq/L (ref 3.5–5.1)

## 2013-02-09 LAB — LIPID PANEL
Cholesterol: 188 mg/dL (ref 0–200)
LDL Cholesterol: 88 mg/dL (ref 0–99)
Total CHOL/HDL Ratio: 2
Triglycerides: 78 mg/dL (ref 0.0–149.0)
VLDL: 15.6 mg/dL (ref 0.0–40.0)

## 2013-02-09 MED ORDER — DESOGESTREL-ETHINYL ESTRADIOL 0.15-30 MG-MCG PO TABS: 1.0000 | ORAL_TABLET | Freq: Every day | ORAL | Status: DC

## 2013-02-09 NOTE — Telephone Encounter (Signed)
Pt left note requesting BC refill to [facility]. Pt has CPX scheduled 02/15/13; was in office this AM for CPX labs. Refilled to [facility].Pt notified.

## 2013-02-15 ENCOUNTER — Ambulatory Visit (INDEPENDENT_AMBULATORY_CARE_PROVIDER_SITE_OTHER): Payer: BC Managed Care – PPO | Admitting: Family Medicine

## 2013-02-15 ENCOUNTER — Encounter: Payer: Self-pay | Admitting: Family Medicine

## 2013-02-15 VITALS — BP 102/62 | HR 76 | Temp 99.2°F | Ht 61.0 in | Wt 129.8 lb

## 2013-02-15 DIAGNOSIS — Z0000 Encounter for general adult medical examination without abnormal findings: Secondary | ICD-10-CM

## 2013-02-15 MED ORDER — DESOGESTREL-ETHINYL ESTRADIOL 0.15-30 MG-MCG PO TABS: 1.0000 | ORAL_TABLET | Freq: Every day | ORAL | Status: DC

## 2013-02-15 NOTE — Patient Instructions (Addendum)
Call about your mammogram (due after 04/09/13).  Take care.  Keep exercising.  Glad to see you.  Recheck in 1 year, sooner if needed.  I'd ask Dr. Neale Burly about the topamax dose when you see him.

## 2013-02-15 NOTE — Progress Notes (Signed)
CPE- See plan.  Routine anticipatory guidance given to patient.  See health maintenance. Tetanus 2013 Flu shot d/w pt.  D/w pt.  She has no reason not to get one.  Pap done 2013 Mammogram done 2013- due again in 4/14.  No lumps, masses, discharge. She'll call about scheduling.  Living will d/w pt.  Husband designated if incapacitated. Diet and exercise d/w pt.  Doing yoga and has healthy diet.    Migraines.  Mult (8-15) per month, worse with hormonal changes and weather changes.  Good effect with meds but drowsy with zomig.  She is using her meds with overall good effect.  She is seeing Dr. Neale Burly in the next few weeks and she'll d/w him.   PMH and SH reviewed  Meds, vitals, and allergies reviewed.   ROS: See HPI.  Otherwise negative.    GEN: nad, alert and oriented HEENT: mucous membranes moist NECK: supple w/o LA CV: rrr. PULM: ctab, no inc wob ABD: soft, +bs EXT: no edema SKIN: no acute rash Breast exam: No mass, nodules, thickening, tenderness, bulging, retraction, inflamation, nipple discharge or skin changes noted.  No axillary or clavicular LA.  Chaperoned exam.

## 2013-02-16 NOTE — Assessment & Plan Note (Signed)
Routine anticipatory guidance given to patient.  See health maintenance. Tetanus 2013 Flu shot d/w pt.  D/w pt.  She has no reason not to get one.  Pap done 2013 Mammogram done 2013- due again in 4/14.  No lumps, masses, discharge. She'll call about scheduling.  Living will d/w pt.  Husband designated if incapacitated. Diet and exercise d/w pt.  Doing yoga and has healthy diet.

## 2013-04-20 ENCOUNTER — Ambulatory Visit: Payer: Self-pay | Admitting: Family Medicine

## 2013-04-20 ENCOUNTER — Encounter: Payer: Self-pay | Admitting: Family Medicine

## 2013-04-21 ENCOUNTER — Encounter: Payer: Self-pay | Admitting: Family Medicine

## 2013-04-22 ENCOUNTER — Encounter: Payer: Self-pay | Admitting: *Deleted

## 2013-06-12 ENCOUNTER — Encounter: Payer: Self-pay | Admitting: Family Medicine

## 2013-06-12 ENCOUNTER — Ambulatory Visit (INDEPENDENT_AMBULATORY_CARE_PROVIDER_SITE_OTHER): Payer: BC Managed Care – PPO | Admitting: Family Medicine

## 2013-06-12 VITALS — BP 94/70 | Temp 97.7°F | Wt 129.0 lb

## 2013-06-12 DIAGNOSIS — R300 Dysuria: Secondary | ICD-10-CM

## 2013-06-12 DIAGNOSIS — N390 Urinary tract infection, site not specified: Secondary | ICD-10-CM

## 2013-06-12 LAB — POCT URINALYSIS DIPSTICK
Ketones, UA: NEGATIVE
Protein, UA: NEGATIVE
Urobilinogen, UA: 0.2
pH, UA: 7

## 2013-06-12 MED ORDER — CIPROFLOXACIN HCL 500 MG PO TABS: 500.0000 mg | ORAL_TABLET | Freq: Two times a day (BID) | ORAL | Status: DC

## 2013-06-12 MED ORDER — CIPROFLOXACIN HCL 250 MG PO TABS: ORAL_TABLET | ORAL | Status: DC

## 2013-06-12 NOTE — Progress Notes (Signed)
  Subjective:    Patient ID: Margaret Brewer, female    DOB: [birth year], 47 y.o.   MRN: [id number]  HPI Here for advice about UTIs. For the past 3 days she has had urinary urgency, odor, and burning. No nausea or fever. Drinking plenty of water. She gets these about once a year. She notices a pattern that she gets them if she has had frequent intercourse, which she had this past week while she and her husband were on vacation. She is careful to urinate and take a shower shortly after intercourse.    Review of Systems  Constitutional: Negative.   Genitourinary: Positive for dysuria, urgency and frequency. Negative for hematuria, flank pain and pelvic pain.       Objective:   Physical Exam  Constitutional: She appears well-developed and well-nourished.  Abdominal: Soft. Bowel sounds are normal. She exhibits no distension and no mass. There is no tenderness. There is no rebound and no guarding.          Assessment & Plan:  For the acute UTI she will take Cipro 500 mg bid for 3 days. For prevention after this she will try taking a single 250 mg dose of Cipro whenever she has intercourse. Follow up with Dr. Para March prn

## 2013-06-14 ENCOUNTER — Telehealth: Payer: Self-pay | Admitting: Family Medicine

## 2013-06-14 NOTE — Telephone Encounter (Signed)
[facility] Triage Call Report Triage Record Num: [id number] Operator: Albertine Grates Patient Name: Margaret Brewer Call Date & Time: 06/11/2013 7:11:19PM Patient Phone: [phone] PCP: Crawford Givens Patient Gender: Female PCP Fax : Patient DOB: August 30, 1966 Practice Name: [facility] Reason for Call: Caller: [name]/Patient; PCP: Crawford Givens [name]) (Family Practice); CB#: [phone]; Has urgency with urination since 6-26. Denies flank/back pain. Afebrile. Per Urinary Symptoms protocol, advised appointment 6-28 due to 1 or more urinary tract symptoms and scheduled for 0945 in [place] office. Protocol(s) Used: Urinary Symptoms - Female Recommended Outcome per Protocol: See Provider within 24 hours Reason for Outcome: Has one or more urinary tract symptoms AND has not been previously evaluated Care Advice: ~ Call provider if you develop flank or low back pain, fever, generally feel sick. Limit carbonated, alcoholic, and caffeinated beverages such as coffee, tea and soda. Avoid nonprescription cold and allergy medications that contain caffeine. Limit intake of tomatoes, fruit juices (except for unsweetened cranberry juice), dairy products, spicy foods, sugar, and artificial sweeteners (aspartame or saccharine). Stop or decrease smoking. Reducing exposure to bladder irritants may help lessen urgency. ~ 06/11/2013 7:21:16PM Page 1 of 1 [id number]

## 2014-02-10 ENCOUNTER — Other Ambulatory Visit: Payer: Self-pay | Admitting: Family Medicine

## 2014-02-10 ENCOUNTER — Other Ambulatory Visit: Payer: BC Managed Care – PPO

## 2014-02-10 DIAGNOSIS — Z131 Encounter for screening for diabetes mellitus: Secondary | ICD-10-CM

## 2014-02-15 ENCOUNTER — Other Ambulatory Visit (INDEPENDENT_AMBULATORY_CARE_PROVIDER_SITE_OTHER): Payer: BC Managed Care – PPO

## 2014-02-15 DIAGNOSIS — Z131 Encounter for screening for diabetes mellitus: Secondary | ICD-10-CM

## 2014-02-15 LAB — GLUCOSE, RANDOM: GLUCOSE: 84 mg/dL (ref 70–99)

## 2014-02-16 ENCOUNTER — Ambulatory Visit (INDEPENDENT_AMBULATORY_CARE_PROVIDER_SITE_OTHER): Payer: BC Managed Care – PPO | Admitting: Family Medicine

## 2014-02-16 ENCOUNTER — Encounter: Payer: Self-pay | Admitting: Family Medicine

## 2014-02-16 VITALS — BP 98/64 | HR 80 | Temp 98.6°F | Ht 62.0 in | Wt 127.5 lb

## 2014-02-16 DIAGNOSIS — G43009 Migraine without aura, not intractable, without status migrainosus: Secondary | ICD-10-CM

## 2014-02-16 DIAGNOSIS — Z0000 Encounter for general adult medical examination without abnormal findings: Secondary | ICD-10-CM

## 2014-02-16 MED ORDER — DESOGESTREL-ETHINYL ESTRADIOL 0.15-30 MG-MCG PO TABS: 1.0000 | ORAL_TABLET | Freq: Every day | ORAL | Status: DC

## 2014-02-16 NOTE — Progress Notes (Signed)
Pre visit review using our clinic review tool, if applicable. No additional management support is needed unless otherwise documented below in the visit note.  CPE- See plan.  Routine anticipatory guidance given to patient.  See health maintenance. Lipids wnl prev.  Sugar wnl.  Yoga several times a week.  Working on diet.   Mammogram due in 04/2014, she'll call about that.  DXA not due.  Discussed.  PAP smear done 2013. Colon cancer screening at 50.   Tdap 2013 Flu shot d/w.  Done this past fall, 2014 Doing online work for her masters for higher ed, started this past fall.  She'll finish in 2017.   Migraines w/o aura. Triggered with weather changes and periods.  She is taking OCPs straight through with a period four times a year.  The migraines the week of a period are awful.  Her periods have gotten a lot lighter recently.  We talked about options.  She wanted to go to 2 periods a year as a trial.   This is reasonable.  Seeing Dr. Neale BurlyFreeman.    Her stepdad had leukemia and died about 2 months ago.    PMH and SH reviewed  Meds, vitals, and allergies reviewed.   ROS: See HPI.  Otherwise negative.    GEN: nad, alert and oriented HEENT: mucous membranes moist NECK: supple w/o LA CV: rrr. PULM: ctab, no inc wob ABD: soft, +bs EXT: no edema SKIN: no acute rash CN 2-12 wnl B, S/S/DTR wnl x4

## 2014-02-16 NOTE — Patient Instructions (Addendum)
Call about a mammogram in 04/2014.  Thanks for getting a flu shot.  If the OCP change doesn't help, then let me and Neale BurlyFreeman know.  If you stop having periods consistently, then notify me.  We'll go from there.

## 2014-02-17 NOTE — Assessment & Plan Note (Signed)
Triggered with weather changes and periods. She is taking OCPs straight through with a period four times a year. The migraines the week of a period are awful. Her periods have gotten a lot lighter recently. We talked about options. She wanted to go to 2 periods a year as a trial. This is reasonable. Seeing Dr. Neale BurlyFreeman.  The main issue here will be when to come off OCPs, assuming she does well this year.  If she has no periods this year, then she'll contact me.  If she does well this year, we'll readdress in ~12 months. She agrees with plan.

## 2014-02-17 NOTE — Assessment & Plan Note (Signed)
Routine anticipatory guidance given to patient.  See health maintenance. Lipids wnl prev.  Sugar wnl.  Yoga several times a week.  Working on diet.   Mammogram due in 04/2014, she'll call about that.  DXA not due.  Discussed.  PAP smear done 2013. Colon cancer screening at 50.   Tdap 2013 Flu shot d/w.  Done this past fall.   Doing online work for her masters for higher ed, started this past fall.  She'll finish in 2017.

## 2014-05-13 ENCOUNTER — Ambulatory Visit (INDEPENDENT_AMBULATORY_CARE_PROVIDER_SITE_OTHER): Payer: BC Managed Care – PPO | Admitting: Family Medicine

## 2014-05-13 ENCOUNTER — Encounter: Payer: Self-pay | Admitting: Family Medicine

## 2014-05-13 VITALS — BP 108/70 | HR 75 | Temp 99.1°F | Wt 125.2 lb

## 2014-05-13 DIAGNOSIS — R141 Gas pain: Secondary | ICD-10-CM

## 2014-05-13 DIAGNOSIS — R142 Eructation: Secondary | ICD-10-CM

## 2014-05-13 DIAGNOSIS — R6881 Early satiety: Secondary | ICD-10-CM

## 2014-05-13 DIAGNOSIS — R140 Abdominal distension (gaseous): Secondary | ICD-10-CM

## 2014-05-13 DIAGNOSIS — R143 Flatulence: Secondary | ICD-10-CM

## 2014-05-13 LAB — CBC WITH DIFFERENTIAL/PLATELET
BASOS ABS: 0 10*3/uL (ref 0.0–0.1)
BASOS PCT: 0 % (ref 0–1)
Eosinophils Absolute: 0.1 10*3/uL (ref 0.0–0.7)
Eosinophils Relative: 2 % (ref 0–5)
HCT: 36.5 % (ref 36.0–46.0)
HEMOGLOBIN: 12.6 g/dL (ref 12.0–15.0)
Lymphocytes Relative: 42 % (ref 12–46)
Lymphs Abs: 2.2 10*3/uL (ref 0.7–4.0)
MCH: 30.3 pg (ref 26.0–34.0)
MCHC: 34.5 g/dL (ref 30.0–36.0)
MCV: 87.7 fL (ref 78.0–100.0)
Monocytes Absolute: 0.3 10*3/uL (ref 0.1–1.0)
Monocytes Relative: 5 % (ref 3–12)
NEUTROS ABS: 2.7 10*3/uL (ref 1.7–7.7)
Neutrophils Relative %: 51 % (ref 43–77)
Platelets: 271 10*3/uL (ref 150–400)
RBC: 4.16 MIL/uL (ref 3.87–5.11)
RDW: 12.6 % (ref 11.5–15.5)
WBC: 5.2 10*3/uL (ref 4.0–10.5)

## 2014-05-13 NOTE — Patient Instructions (Signed)
Try taking zantac 150mg  a day in the meantime. Go to the lab on the way out.  We'll contact you with your lab report. If you aren't improved, then let me know.  Take care.

## 2014-05-13 NOTE — Progress Notes (Signed)
Pre visit review using our clinic review tool, if applicable. No additional management support is needed unless otherwise documented below in the visit note.  She hasn't had a period since the last OV- see prev notes re: OCPs and migraines.    She didn't know if she had a stomach virus initially.  ~04/12/14 she had some fish and salad.  Her husband had the same and was fine but she has nocturnal diarrhea, continued until the next AM.   For the next few days she would feel bloated after eating, even small amounts of food.  After a few days she "got back to normal" for a few days.  Now with 3 weeks of bloated feeling after eating.  No diarrhea now.  No vomiting and no nausea throughout this period of time.   No fevers.  No jaundice.  No rash.  Fatigued.  No heartburn.  More gas and burping.  Not passing blood.  No med changes o/w, except for the OCPs as above and that wasn't a sig change.  H/o cholecystectomy 12 years ago.  No abd pain.  She has the same reaction to most foods, not better or worse with diet changes.  Healthy diet at baseline.  She has had occ mucous in her stools, but that predates the other sx.    Meds, vitals, and allergies reviewed.   ROS: See HPI.  Otherwise, noncontributory.  GEN: nad, alert and oriented HEENT: mucous membranes moist NECK: supple w/o LA CV: rrr. PULM: ctab, no inc wob ABD: soft, +bs, very minimally ttp in the BLQs but no rebound.   EXT: no edema SKIN: no acute rash

## 2014-05-14 DIAGNOSIS — R141 Gas pain: Secondary | ICD-10-CM | POA: Insufficient documentation

## 2014-05-14 DIAGNOSIS — R140 Abdominal distension (gaseous): Secondary | ICD-10-CM | POA: Insufficient documentation

## 2014-05-14 LAB — COMPREHENSIVE METABOLIC PANEL
ALBUMIN: 4.5 g/dL (ref 3.5–5.2)
ALT: 13 U/L (ref 0–35)
AST: 15 U/L (ref 0–37)
Alkaline Phosphatase: 33 U/L — ABNORMAL LOW (ref 39–117)
BUN: 15 mg/dL (ref 6–23)
CHLORIDE: 106 meq/L (ref 96–112)
CO2: 23 meq/L (ref 19–32)
Calcium: 9.4 mg/dL (ref 8.4–10.5)
Creat: 0.76 mg/dL (ref 0.50–1.10)
GLUCOSE: 88 mg/dL (ref 70–99)
POTASSIUM: 3.5 meq/L (ref 3.5–5.3)
Sodium: 140 mEq/L (ref 135–145)
Total Bilirubin: 0.5 mg/dL (ref 0.2–1.2)
Total Protein: 6.8 g/dL (ref 6.0–8.3)

## 2014-05-14 LAB — LIPASE: Lipase: 35 U/L (ref 0–75)

## 2014-05-14 NOTE — Assessment & Plan Note (Signed)
Unclear source. See notes on labs.  We can consider imaging if labs unremarkable and no help with H2 blockade.  D/w pt.  No emergent sx.  She agrees. >25 minutes spent in face to face time with patient, >50% spent in counselling or coordination of care.

## 2014-05-25 ENCOUNTER — Ambulatory Visit: Payer: Self-pay | Admitting: Family Medicine

## 2014-05-26 ENCOUNTER — Encounter: Payer: Self-pay | Admitting: Family Medicine

## 2014-05-27 ENCOUNTER — Encounter: Payer: Self-pay | Admitting: Family Medicine

## 2014-06-03 ENCOUNTER — Ambulatory Visit (INDEPENDENT_AMBULATORY_CARE_PROVIDER_SITE_OTHER): Payer: BC Managed Care – PPO | Admitting: Family Medicine

## 2014-06-03 ENCOUNTER — Encounter: Payer: Self-pay | Admitting: Family Medicine

## 2014-06-03 VITALS — BP 98/64 | HR 73 | Temp 97.6°F | Wt 124.5 lb

## 2014-06-03 DIAGNOSIS — R109 Unspecified abdominal pain: Secondary | ICD-10-CM

## 2014-06-03 NOTE — Patient Instructions (Addendum)
You may have some spotting in the next few days.  I wouldn't do anything differently now.  Taper off the zantac as planned. Take care.

## 2014-06-03 NOTE — Progress Notes (Signed)
Pre visit review using our clinic review tool, if applicable. No additional management support is needed unless otherwise documented below in the visit note.  She was better after the last OV, after adding H2 blocker.  Yesterday she had more pain, but it wasn't like prev. Lower abd pain. It felt like a menstrual cramp but she doesn't typically have those.  Sharp pain, episodic, yesterday.  Wasn't constant.  She feels fine today.   She did have an OCP change to have a period every 6 months.  LMP was ~01/2014.  No bleeding or spotting.  No burning with urination.  No back pain.  No vomiting, no diarrhea.  No fevers.    Meds, vitals, and allergies reviewed.   ROS: See HPI.  Otherwise, noncontributory.  GEN: nad, alert and oriented HEENT: mucous membranes moist NECK: supple w/o LA CV: rrr PULM: ctab, no inc wob ABD: soft, +bs EXT: no edema SKIN: no acute rash

## 2014-06-05 DIAGNOSIS — R109 Unspecified abdominal pain: Secondary | ICD-10-CM | POA: Insufficient documentation

## 2014-06-05 NOTE — Assessment & Plan Note (Signed)
Resolved now. Normal exam.  No sign of ominous process.  With the OCP change, this could have been a menstrual issue.  She may have some spotting in the next few days.  F/u prn.  She agrees.

## 2015-02-19 ENCOUNTER — Other Ambulatory Visit: Payer: Self-pay | Admitting: Family Medicine

## 2015-02-19 DIAGNOSIS — G43009 Migraine without aura, not intractable, without status migrainosus: Secondary | ICD-10-CM

## 2015-02-23 ENCOUNTER — Other Ambulatory Visit (INDEPENDENT_AMBULATORY_CARE_PROVIDER_SITE_OTHER): Payer: BLUE CROSS/BLUE SHIELD

## 2015-02-23 DIAGNOSIS — G43009 Migraine without aura, not intractable, without status migrainosus: Secondary | ICD-10-CM

## 2015-02-23 LAB — BASIC METABOLIC PANEL
BUN: 15 mg/dL (ref 6–23)
CHLORIDE: 107 meq/L (ref 96–112)
CO2: 25 mEq/L (ref 19–32)
Calcium: 9.7 mg/dL (ref 8.4–10.5)
Creatinine, Ser: 0.9 mg/dL (ref 0.40–1.20)
GFR: 70.95 mL/min (ref >60.00)
Glucose, Bld: 90 mg/dL (ref 70–99)
Potassium: 4.2 mEq/L (ref 3.5–5.1)
SODIUM: 136 meq/L (ref 135–145)

## 2015-03-02 ENCOUNTER — Encounter: Payer: Self-pay | Admitting: Family Medicine

## 2015-03-08 ENCOUNTER — Other Ambulatory Visit (HOSPITAL_COMMUNITY)
Admission: RE | Admit: 2015-03-08 | Discharge: 2015-03-08 | Disposition: A | Discharge status: Home / Self Care | Payer: BLUE CROSS/BLUE SHIELD | Source: Ambulatory Visit | Attending: Family Medicine | Admitting: Family Medicine

## 2015-03-08 ENCOUNTER — Encounter: Payer: Self-pay | Admitting: Family Medicine

## 2015-03-08 ENCOUNTER — Ambulatory Visit (INDEPENDENT_AMBULATORY_CARE_PROVIDER_SITE_OTHER): Payer: BLUE CROSS/BLUE SHIELD | Admitting: Family Medicine

## 2015-03-08 VITALS — BP 98/64 | HR 76 | Temp 98.8°F | Ht 62.0 in | Wt 118.0 lb

## 2015-03-08 DIAGNOSIS — Z1151 Encounter for screening for human papillomavirus (HPV): Secondary | ICD-10-CM | POA: Diagnosis present

## 2015-03-08 DIAGNOSIS — Z124 Encounter for screening for malignant neoplasm of cervix: Secondary | ICD-10-CM

## 2015-03-08 DIAGNOSIS — G43009 Migraine without aura, not intractable, without status migrainosus: Secondary | ICD-10-CM | POA: Diagnosis not present

## 2015-03-08 DIAGNOSIS — R634 Abnormal weight loss: Secondary | ICD-10-CM | POA: Diagnosis not present

## 2015-03-08 DIAGNOSIS — R140 Abdominal distension (gaseous): Secondary | ICD-10-CM | POA: Diagnosis not present

## 2015-03-08 DIAGNOSIS — Z01419 Encounter for gynecological examination (general) (routine) without abnormal findings: Secondary | ICD-10-CM | POA: Insufficient documentation

## 2015-03-08 DIAGNOSIS — Z7189 Other specified counseling: Secondary | ICD-10-CM

## 2015-03-08 DIAGNOSIS — Z0000 Encounter for general adult medical examination without abnormal findings: Secondary | ICD-10-CM | POA: Diagnosis not present

## 2015-03-08 MED ORDER — DESOGESTREL-ETHINYL ESTRADIOL 0.15-30 MG-MCG PO TABS: 1.0000 | ORAL_TABLET | Freq: Every day | ORAL | Status: DC

## 2015-03-08 NOTE — Patient Instructions (Signed)
Go to the lab on the way out.  We'll contact you with your lab report. If you labs are fine, then we should likely have you see GI.  I'll check with Dr. Neale BurlyFreeman about options on your OCPs.  Take care.  Glad to see you.

## 2015-03-08 NOTE — Progress Notes (Signed)
Pre visit review using our clinic review tool, if applicable. No additional management support is needed unless otherwise documented below in the visit note.  CPE- See plan.  Routine anticipatory guidance given to patient.  See health maintenance. Tetanus 2013 Flu encouraged, prev done fall 2015 PNA and shingles not due Colon cancer screening not due.   Mammogram up to date.   Pap due. Done today.  Living will d/w pt.  Husband designated if patient were incapacitated.   DXA- not due yet.   Diet and exercise d/w pt. Working on both.    H/o weight loss over the last year.  She had some bloating that got better with zantac.  She has continued to have some bloating midway through the meal.  She thinks the appetite changes have affected her weight.  She has awoken at night once with burning in the chest and throat.  She got upright and drank some ginger ale with some relief.  That was a one time event, as far as likely sig reflux sx at night.   Migraines are better except for during the times of between pills.  She didn't have bleeding with the last two menses.  She had light menses before with menses every 3 months.  Most women in her family has menopause in early 950s.  Still seeing Dr. Neale BurlyFreeman.    PMH and SH reviewed  Meds, vitals, and allergies reviewed.   ROS: See HPI.  Otherwise negative.    GEN: nad, alert and oriented HEENT: mucous membranes moist NECK: supple w/o LA CV: rrr. PULM: ctab, no inc wob ABD: soft, +bs EXT: no edema SKIN: no acute rash Normal introitus for age, no external lesions, no vaginal discharge, mucosa pink and moist, no vaginal or cervical lesions (except for an old cervical cyst that had been prev noted), no vaginal atrophy, no friaility or hemorrhage, normal uterus size and position, no adnexal masses or tenderness.  Chaperoned exam.

## 2015-03-09 DIAGNOSIS — Z7189 Other specified counseling: Secondary | ICD-10-CM | POA: Insufficient documentation

## 2015-03-09 LAB — CBC WITH DIFFERENTIAL/PLATELET
Basophils Absolute: 0 10*3/uL (ref 0.0–0.1)
Basophils Relative: 0.3 % (ref 0.0–3.0)
Eosinophils Absolute: 0.2 10*3/uL (ref 0.0–0.7)
Eosinophils Relative: 2.5 % (ref 0.0–5.0)
HEMATOCRIT: 38.7 % (ref 36.0–46.0)
Hemoglobin: 13.3 g/dL (ref 12.0–15.0)
Lymphocytes Relative: 40.8 % (ref 12.0–46.0)
Lymphs Abs: 2.5 10*3/uL (ref 0.7–4.0)
MCHC: 34.5 g/dL (ref 30.0–36.0)
MCV: 89.9 fl (ref 78.0–100.0)
MONOS PCT: 5.4 % (ref 3.0–12.0)
Monocytes Absolute: 0.3 10*3/uL (ref 0.1–1.0)
Neutro Abs: 3.2 10*3/uL (ref 1.4–7.7)
Neutrophils Relative %: 51 % (ref 43.0–77.0)
Platelets: 272 10*3/uL (ref 150.0–400.0)
RBC: 4.3 Mil/uL (ref 3.87–5.11)
RDW: 13 % (ref 11.5–15.5)
WBC: 6.2 10*3/uL (ref 4.0–10.5)

## 2015-03-09 LAB — HEPATIC FUNCTION PANEL
ALK PHOS: 30 U/L — AB (ref 39–117)
ALT: 13 U/L (ref 0–35)
AST: 16 U/L (ref 0–37)
Albumin: 4.3 g/dL (ref 3.5–5.2)
BILIRUBIN TOTAL: 0.3 mg/dL (ref 0.2–1.2)
Bilirubin, Direct: 0.1 mg/dL (ref 0.0–0.3)
TOTAL PROTEIN: 7.4 g/dL (ref 6.0–8.3)

## 2015-03-09 LAB — TSH: TSH: 1.8 u[IU]/mL (ref 0.35–4.50)

## 2015-03-09 NOTE — Assessment & Plan Note (Signed)
She's clearly better off overall with the OCPs.  I expect the dec in menses/flow rate to be a benign process.  I'll check with Dr. Neale BurlyFreeman about his ideas for the long run re: ocps.  Continue as is for now.  D/w pt.  She agrees.

## 2015-03-09 NOTE — Assessment & Plan Note (Signed)
Routine anticipatory guidance given to patient. See health maintenance.  Tetanus 2013  Flu encouraged, prev done fall 2015  PNA and shingles not due  Colon cancer screening not due.  Mammogram up to date.  Pap due. Done today.  Living will d/w pt. Husband designated if patient were incapacitated.  DXA- not due yet.  Diet and exercise d/w pt. Working on both.

## 2015-03-09 NOTE — Assessment & Plan Note (Addendum)
With the mild weight loss noted but benign exam today.  She did some better on zantac prev.  She is still active and has a healthy diet.  She has no lower GI sx, no black/bloody stools.  No lower abd pain.  She does have likely GERD and that is noted.  With the weight loss noted, would check basic labs today.  If unremarkable, would still likely have her see GI.  She agrees.

## 2015-03-09 NOTE — Assessment & Plan Note (Signed)
Living will d/w pt.  Husband designated if patient were incapacitated.  

## 2015-03-10 LAB — CYTOLOGY - PAP

## 2015-03-14 ENCOUNTER — Other Ambulatory Visit: Payer: Self-pay | Admitting: Family Medicine

## 2015-03-14 DIAGNOSIS — R140 Abdominal distension (gaseous): Secondary | ICD-10-CM

## 2015-04-07 ENCOUNTER — Ambulatory Visit
Admit: 2015-04-07 | Disposition: A | Discharge status: Home / Self Care | Payer: Self-pay | Attending: Gastroenterology | Admitting: Gastroenterology

## 2015-07-07 ENCOUNTER — Ambulatory Visit: Payer: BLUE CROSS/BLUE SHIELD | Admitting: Anesthesiology

## 2015-07-07 ENCOUNTER — Encounter: Payer: Self-pay | Admitting: Anesthesiology

## 2015-07-07 ENCOUNTER — Encounter
Admission: RE | Disposition: A | Discharge status: Nursing Home (Includes ICF) | Payer: Self-pay | Source: Ambulatory Visit | Attending: Gastroenterology

## 2015-07-07 ENCOUNTER — Ambulatory Visit
Admission: RE | Admit: 2015-07-07 | Discharge: 2015-07-07 | Disposition: A | Discharge status: Nursing Home (Includes ICF) | Payer: BLUE CROSS/BLUE SHIELD | Source: Ambulatory Visit | Attending: Gastroenterology | Admitting: Gastroenterology

## 2015-07-07 DIAGNOSIS — Z79899 Other long term (current) drug therapy: Secondary | ICD-10-CM | POA: Insufficient documentation

## 2015-07-07 DIAGNOSIS — K210 Gastro-esophageal reflux disease with esophagitis: Secondary | ICD-10-CM | POA: Insufficient documentation

## 2015-07-07 DIAGNOSIS — K2970 Gastritis, unspecified, without bleeding: Secondary | ICD-10-CM | POA: Insufficient documentation

## 2015-07-07 DIAGNOSIS — R1013 Epigastric pain: Secondary | ICD-10-CM | POA: Insufficient documentation

## 2015-07-07 DIAGNOSIS — G43009 Migraine without aura, not intractable, without status migrainosus: Secondary | ICD-10-CM | POA: Insufficient documentation

## 2015-07-07 DIAGNOSIS — E785 Hyperlipidemia, unspecified: Secondary | ICD-10-CM | POA: Insufficient documentation

## 2015-07-07 DIAGNOSIS — K319 Disease of stomach and duodenum, unspecified: Secondary | ICD-10-CM | POA: Diagnosis not present

## 2015-07-07 DIAGNOSIS — K449 Diaphragmatic hernia without obstruction or gangrene: Secondary | ICD-10-CM | POA: Insufficient documentation

## 2015-07-07 HISTORY — PX: ESOPHAGOGASTRODUODENOSCOPY (EGD) WITH PROPOFOL: SHX5813

## 2015-07-07 SURGERY — ESOPHAGOGASTRODUODENOSCOPY (EGD) WITH PROPOFOL
Anesthesia: General

## 2015-07-07 MED ORDER — PHENYLEPHRINE HCL 10 MG/ML IJ SOLN
INTRAMUSCULAR | Status: DC | PRN
  Administered 2015-07-07: 100 ug via INTRAVENOUS

## 2015-07-07 MED ORDER — PROPOFOL 10 MG/ML IV BOLUS
INTRAVENOUS | Status: DC | PRN
  Administered 2015-07-07 (×3): 50 mg via INTRAVENOUS

## 2015-07-07 MED ORDER — EPHEDRINE SULFATE 50 MG/ML IJ SOLN
INTRAMUSCULAR | Status: DC | PRN
  Administered 2015-07-07: 10 mg via INTRAVENOUS

## 2015-07-07 MED ORDER — GLYCOPYRROLATE 0.2 MG/ML IJ SOLN
INTRAMUSCULAR | Status: DC | PRN
  Administered 2015-07-07: 0.2 mg via INTRAVENOUS

## 2015-07-07 MED ORDER — LIDOCAINE HCL (CARDIAC) 20 MG/ML IV SOLN
INTRAVENOUS | Status: DC | PRN
  Administered 2015-07-07: 60 mg via INTRAVENOUS

## 2015-07-07 MED ORDER — SODIUM CHLORIDE 0.9 % IV SOLN
INTRAVENOUS | Status: DC | PRN
  Administered 2015-07-07: 15:00:00 via INTRAVENOUS

## 2015-07-07 MED ORDER — SODIUM CHLORIDE 0.9 % IV SOLN
INTRAVENOUS | Status: DC
  Administered 2015-07-07: 14:00:00 via INTRAVENOUS

## 2015-07-07 NOTE — Transfer of Care (Signed)
Immediate Anesthesia Transfer of Care Note  Patient: Margaret Brewer  Procedure(s) Performed: Procedure(s): ESOPHAGOGASTRODUODENOSCOPY (EGD) WITH PROPOFOL (N/A)  Patient Location: Endoscopy Unit  Anesthesia Type:General  Level of Consciousness: awake, alert , oriented and patient cooperative  Airway & Oxygen Therapy: Patient Spontanous Breathing and Patient connected to nasal cannula oxygen  Post-op Assessment: Report given to RN, Post -op Vital signs reviewed and stable and Patient moving all extremities X 4  Post vital signs: Reviewed and stable  Last Vitals:  Filed Vitals:   07/07/15 1517  BP: 100/58  Pulse: 97  Temp: 36.3 C  Resp: 16    Complications: No apparent anesthesia complications

## 2015-07-07 NOTE — Op Note (Signed)
[facility] Gastroenterology Patient Name: Margaret Brewer Procedure Date: 07/07/2015 2:43 PM MRN: [id number] Account #: [id number] Date of Birth: [birth year] Admit Type: Outpatient Age: 49 Room: [facility] ENDO ROOM 2 Gender: Female Note Status: Finalized Procedure:         Upper GI endoscopy Indications:       Epigastric abdominal pain, Dyspepsia Providers:         Christena Deem, MD Referring MD:      Dwana Curd. Para March (Referring MD) Medicines:         Monitored Anesthesia Care Complications:     No immediate complications. Procedure:         Pre-Anesthesia Assessment:                    - ASA Grade Assessment: II - A patient with mild systemic                     disease.                    After obtaining informed consent, the endoscope was passed                     under direct vision. Throughout the procedure, the                     patient's blood pressure, pulse, and oxygen saturations                     were monitored continuously. The Endoscope was introduced                     through the mouth, and advanced to the third part of                     duodenum. The upper GI endoscopy was accomplished without                     difficulty. The patient tolerated the procedure well. Findings:      The Z-line was regular.      LA Grade A (one or more mucosal breaks less than 5 mm, not extending       between tops of 2 mucosal folds) esophagitis was found.      A small hiatus hernia was present.      Diffuse mild inflammation characterized by congestion (edema), erythema       and granularity was found in the gastric body and in the gastric antrum.       Biopsies were taken with a cold forceps for histology.      The examined duodenum was normal. Impression:        - Z-line regular.                    - LA Grade A reflux esophagitis.                    - Small hiatus hernia.                    - Bile gastritis. Biopsied.                    - Normal  examined duodenum. Recommendation:    - Discharge patient to home.                    -  Use Aciphex (rabeprazole) 20 mg PO daily daily.                    - Use sucralfate tablets 1 gram PO QID daily.                    - Return to GI clinic in 4 weeks. Procedure Code(s): --- Professional ---                    [phone], Esophagogastroduodenoscopy, flexible, transoral;                     with biopsy, single or multiple Diagnosis Code(s): --- Professional ---                    530.11, Reflux esophagitis                    535.40, Other specified gastritis, without mention of                     hemorrhage                    789.06, Abdominal pain, epigastric                    536.8, Dyspepsia and other specified disorders of function                     of stomach                    553.3, Diaphragmatic hernia without mention of obstruction                     or gangrene CPT copyright 2014 [employer] Association. All rights reserved. The codes documented in this report are preliminary and upon coder review may  be revised to meet current compliance requirements. Christena Deem, MD 07/07/2015 3:13:59 PM This report has been signed electronically. Number of Addenda: 0 Note Initiated On: 07/07/2015 2:43 PM      [facility]

## 2015-07-07 NOTE — H&P (Signed)
Outpatient short stay form Pre-procedure 07/07/2015 2:47 PM Margaret Deem MD  Primary Physician: Dr. Para March  Reason for visit:  EGD  History of present illness:  Patient is a 49 year old female presenting today complaining of epigastric pain and bloating. Adding dyspepsia symptoms. Was placed on a trial of omeprazole 20 mg daily and felt that it gave her migraine headaches. She has been take some Zantac. She does take diclofenac perhaps twice a year for a couple days. Does take liquid NSAID as well as a aspirin on occasion. He has seen no black stool or bloody stools.    Current facility-administered medications:  .  0.9 %  sodium chloride infusion, , Intravenous, Continuous, Margaret Deem, MD, Last Rate: 20 mL/hr at 07/07/15 1352  Prescriptions prior to admission  Medication Sig Dispense Refill Last Dose  . Calcium Carb-Cholecalciferol 600-400 MG-UNIT TABS Take 1 tablet by mouth daily.   Past Week at Unknown time  . Calcium Carbonate-Vitamin D (TH CALCIUM CARBONATE-VITAMIN D) 600-400 MG-UNIT per tablet Take 1 tablet by mouth daily.   Past Week at Unknown time  . diclofenac (CATAFLAM) 50 MG tablet Take 50 mg by mouth 3 (three) times daily.     Marland Kitchen gabapentin (NEURONTIN) 600 MG tablet Take 600 mg by mouth 3 (three) times daily as needed.    07/07/2015 at Unknown time  . omeprazole (PRILOSEC) 20 MG capsule Take 20 mg by mouth daily.     Marland Kitchen topiramate (TOPAMAX) 200 MG tablet Take 300 mg by mouth daily.    07/07/2015 at Unknown time  . ciprofloxacin (CIPRO) 250 MG tablet Take one dose after intercourse (Patient not taking: Reported on 07/07/2015) 30 tablet 0 Not Taking  . desogestrel-ethinyl estradiol (APRI,EMOQUETTE,SOLIA) 0.15-30 MG-MCG tablet Take 1 tablet by mouth daily. 3 Package 3   . fish oil-omega-3 fatty acids 1000 MG capsule Take 1 g by mouth daily.   Taking  . Multiple Vitamin (MULTIVITAMIN) tablet Take 1 tablet by mouth daily.   Taking  . ranitidine (ZANTAC) 150 MG capsule Take  150 mg by mouth every morning.   Taking  . vitamin C (ASCORBIC ACID) 500 MG tablet Take 500 mg by mouth 2 (two) times daily.   Taking  . zolmitriptan (ZOMIG) 5 MG tablet Take 5 mg by mouth as needed.   Taking     Allergies  Allergen Reactions  . Cephalexin     rash  . Erythromycin     GI upset  . Nitrofurantoin     rash  . Penicillins     rash  . Sulfonamide Derivatives     hives     Past Medical History  Diagnosis Date  . Migraine without aura     varies with hormonal changes and weather changes  . Hyperlipidemia     with high HDL    Review of systems:      Physical Exam    Heart and lungs:regular rate and rhythm without rub or gallop, lungs are bilaterally clear  HEENT:Normal cephalic atraumatic eyes are anicteric Other:     Pertinant exam for procedure:Soft nontender nondistended bowel sounds positive normoactive     Planned proceedures:EGD and indicated procedures I have discussed the risks benefits and complications of procedures to include not limited to bleeding, infection, perforation and the risk of sedation and the patient wishes to proceed.    Margaret Deem, MD Gastroenterology 07/07/2015  2:47 PM

## 2015-07-07 NOTE — Anesthesia Preprocedure Evaluation (Addendum)
Anesthesia Evaluation  Patient identified by MRN, date of birth, ID band Patient awake    Reviewed: Allergy & Precautions, H&P , NPO status , Patient's Chart, lab work & pertinent test results, reviewed documented beta blocker date and time   Airway Mallampati: II  TM Distance: >3 FB Neck ROM: full    Dental no notable dental hx. (+) Teeth Intact   Pulmonary neg pulmonary ROS,  breath sounds clear to auscultation  Pulmonary exam normal       Cardiovascular Exercise Tolerance: Good negative cardio ROS Normal cardiovascular examRhythm:regular Rate:Normal     Neuro/Psych  Headaches, negative psych ROS   GI/Hepatic Neg liver ROS, GERD-  Controlled,  Endo/Other  negative endocrine ROS  Renal/GU negative Renal ROS  negative genitourinary   Musculoskeletal   Abdominal   Peds  Hematology negative hematology ROS (+)   Anesthesia Other Findings Past Medical History:   Migraine without aura                                          Comment:varies with hormonal changes and weather               changes   Hyperlipidemia                                                 Comment:with high HDL   Reproductive/Obstetrics negative OB ROS                            Anesthesia Physical Anesthesia Plan  ASA: III  Anesthesia Plan: General   Post-op Pain Management:    Induction:   Airway Management Planned:   Additional Equipment:   Intra-op Plan:   Post-operative Plan:   Informed Consent: I have reviewed the patients History and Physical, chart, labs and discussed the procedure including the risks, benefits and alternatives for the proposed anesthesia with the patient or authorized representative who has indicated his/her understanding and acceptance.   Dental Advisory Given  Plan Discussed with: Anesthesiologist, CRNA and Surgeon  Anesthesia Plan Comments:         Anesthesia Quick  Evaluation

## 2015-07-08 NOTE — Anesthesia Postprocedure Evaluation (Signed)
  Anesthesia Post-op Note  Patient: Margaret Brewer  Procedure(s) Performed: Procedure(s): ESOPHAGOGASTRODUODENOSCOPY (EGD) WITH PROPOFOL (N/A)  Anesthesia type:General  Patient location: PACU  Post pain: Pain level controlled  Post assessment: Post-op Vital signs reviewed, Patient's Cardiovascular Status Stable, Respiratory Function Stable, Patent Airway and No signs of Nausea or vomiting  Post vital signs: Reviewed and stable  Last Vitals:  Filed Vitals:   07/07/15 1547  BP: 95/68  Pulse: 95  Temp:   Resp: 15    Level of consciousness: awake, alert  and patient cooperative  Complications: No apparent anesthesia complications

## 2015-07-10 ENCOUNTER — Encounter: Payer: Self-pay | Admitting: Gastroenterology

## 2015-07-12 LAB — SURGICAL PATHOLOGY

## 2015-07-18 ENCOUNTER — Telehealth: Payer: Self-pay | Admitting: Family Medicine

## 2015-07-18 DIAGNOSIS — R140 Abdominal distension (gaseous): Secondary | ICD-10-CM

## 2015-07-18 NOTE — Telephone Encounter (Signed)
Pt has not been happy with [facility]. She stated that she has seen them 3 times and fought with them 2 times.  Can she go somewhere else? Possible [facility]? Call back number [phone] Thanks

## 2015-07-19 NOTE — Telephone Encounter (Signed)
Referred.  Thanks.

## 2015-07-19 NOTE — Telephone Encounter (Signed)
Patient notified as instructed by telephone that a referred has been put in and she will hear back from the referral coordinator to get this set up.

## 2015-09-21 ENCOUNTER — Other Ambulatory Visit: Payer: Self-pay | Admitting: Gastroenterology

## 2015-09-21 DIAGNOSIS — R1013 Epigastric pain: Secondary | ICD-10-CM

## 2015-10-03 ENCOUNTER — Other Ambulatory Visit
Admission: RE | Admit: 2015-10-03 | Discharge: 2015-10-03 | Disposition: A | Discharge status: Home / Self Care | Payer: BLUE CROSS/BLUE SHIELD | Source: Ambulatory Visit | Attending: Gastroenterology | Admitting: Gastroenterology

## 2015-10-03 DIAGNOSIS — Z3200 Encounter for pregnancy test, result unknown: Secondary | ICD-10-CM | POA: Diagnosis not present

## 2015-10-03 LAB — PREGNANCY, URINE: Preg Test, Ur: NEGATIVE

## 2015-10-04 ENCOUNTER — Ambulatory Visit
Admission: RE | Admit: 2015-10-04 | Discharge: 2015-10-04 | Disposition: A | Discharge status: Home / Self Care | Payer: BLUE CROSS/BLUE SHIELD | Source: Ambulatory Visit | Attending: Gastroenterology | Admitting: Gastroenterology

## 2015-10-04 DIAGNOSIS — R1013 Epigastric pain: Secondary | ICD-10-CM

## 2015-10-04 MED ORDER — TECHNETIUM TC 99M SULFUR COLLOID
2.0050 | Freq: Once | INTRAVENOUS | Status: DC | PRN
  Administered 2015-10-04: 2.005 via INTRAVENOUS
  Filled 2015-10-04: qty 2.01

## 2016-02-28 ENCOUNTER — Encounter: Payer: Self-pay | Admitting: *Deleted

## 2016-02-28 ENCOUNTER — Ambulatory Visit (INDEPENDENT_AMBULATORY_CARE_PROVIDER_SITE_OTHER)
Admission: RE | Admit: 2016-02-28 | Discharge: 2016-02-28 | Disposition: A | Discharge status: Home / Self Care | Payer: BLUE CROSS/BLUE SHIELD | Source: Ambulatory Visit | Attending: Family Medicine | Admitting: Family Medicine

## 2016-02-28 ENCOUNTER — Encounter: Payer: Self-pay | Admitting: Family Medicine

## 2016-02-28 ENCOUNTER — Ambulatory Visit (INDEPENDENT_AMBULATORY_CARE_PROVIDER_SITE_OTHER): Payer: BLUE CROSS/BLUE SHIELD | Admitting: Family Medicine

## 2016-02-28 VITALS — BP 100/60 | HR 84 | Temp 99.0°F | Wt 126.8 lb

## 2016-02-28 DIAGNOSIS — S93402A Sprain of unspecified ligament of left ankle, initial encounter: Secondary | ICD-10-CM

## 2016-02-28 DIAGNOSIS — S92302A Fracture of unspecified metatarsal bone(s), left foot, initial encounter for closed fracture: Secondary | ICD-10-CM | POA: Diagnosis not present

## 2016-02-28 MED ORDER — DICLOFENAC POTASSIUM 50 MG PO TABS: 50.0000 mg | ORAL_TABLET | Freq: Three times a day (TID) | ORAL | Status: DC

## 2016-02-28 NOTE — Progress Notes (Signed)
Pre visit review using our clinic review tool, if applicable. No additional management support is needed unless otherwise documented below in the visit note.  L foot injury.  2 nights ago.  She was at the hair salon.  She stood up and was reaching, felt her foot go out from underneath her.  Inward sprain.  Fell to ground.  Didn't hit her head.  She was able to get up, she walked/limped out.  Puffy and bruised L later foot.  Was more puffy and bruised yesterday.  Still pain with walking.  Has been icing it.    Meds, vitals, and allergies reviewed.   ROS: See HPI.  Otherwise, noncontributory.  nad ncat L foot puffy, minimally bruised.  Normal DP pulse Med and lat mal not ttp but prox 5th MT ttp Pain with weight bearing initially but not in CAM walker boot.

## 2016-02-28 NOTE — Patient Instructions (Signed)
Shirlee LimerickMarion will call about your referral. Wear the boot as much as possible. Ice your food, take diclofenac for pain.  Take care.  Glad to see you.

## 2016-02-29 ENCOUNTER — Other Ambulatory Visit: Payer: Self-pay | Admitting: Family Medicine

## 2016-02-29 DIAGNOSIS — G43009 Migraine without aura, not intractable, without status migrainosus: Secondary | ICD-10-CM

## 2016-02-29 DIAGNOSIS — S92353A Displaced fracture of fifth metatarsal bone, unspecified foot, initial encounter for closed fracture: Secondary | ICD-10-CM | POA: Insufficient documentation

## 2016-02-29 NOTE — Assessment & Plan Note (Signed)
D/w pt.   Images reviewed with patient.  In CAM walker boot w/o pain.  Continue nsaid with GI caution.  Refer to ortho.  Routine cautions given.  See avs.  >25 minutes spent in face to face time with patient, >50% spent in counselling or coordination of care.

## 2016-03-08 ENCOUNTER — Other Ambulatory Visit: Payer: BLUE CROSS/BLUE SHIELD

## 2016-03-15 ENCOUNTER — Encounter: Payer: Self-pay | Admitting: Family Medicine

## 2016-03-15 ENCOUNTER — Other Ambulatory Visit: Payer: Self-pay | Admitting: Family Medicine

## 2016-03-15 ENCOUNTER — Ambulatory Visit (INDEPENDENT_AMBULATORY_CARE_PROVIDER_SITE_OTHER): Payer: BLUE CROSS/BLUE SHIELD | Admitting: Family Medicine

## 2016-03-15 VITALS — BP 92/50 | HR 74 | Temp 99.1°F | Ht 61.0 in | Wt 125.8 lb

## 2016-03-15 DIAGNOSIS — Z0000 Encounter for general adult medical examination without abnormal findings: Secondary | ICD-10-CM | POA: Diagnosis not present

## 2016-03-15 DIAGNOSIS — Z7189 Other specified counseling: Secondary | ICD-10-CM

## 2016-03-15 DIAGNOSIS — Z1231 Encounter for screening mammogram for malignant neoplasm of breast: Secondary | ICD-10-CM

## 2016-03-15 DIAGNOSIS — G43009 Migraine without aura, not intractable, without status migrainosus: Secondary | ICD-10-CM

## 2016-03-15 MED ORDER — CIPROFLOXACIN HCL 250 MG PO TABS
ORAL_TABLET | ORAL | Status: DC
Start: 2016-03-15 — End: 2017-03-19

## 2016-03-15 MED ORDER — DESOGESTREL-ETHINYL ESTRADIOL 0.15-30 MG-MCG PO TABS: 1.0000 | ORAL_TABLET | Freq: Every day | ORAL | Status: DC

## 2016-03-15 NOTE — Progress Notes (Signed)
Pre visit review using our clinic review tool, if applicable. No additional management support is needed unless otherwise documented below in the visit note.  CPE- See plan.  Routine anticipatory guidance given to patient.  See health maintenance. Tetanus 2013 Flu encouraged, prev done fall 2016  PNA and shingles not due Colon cancer screening not due.  Mammogram due, d/w pt.  Pap not due. D/w pt.   Living will d/w pt. Husband designated if patient were incapacitated.  DXA- done recently per ortho- she has f/u pending.   Diet and exercise d/w pt. Working on both except for limited by foot fx.  [facility] since [facility].    Seeing Dr. Neale BurlyFreeman re: migraines with zonisamide added.  Still on long course OCPs to limit menses and migraines.  It makes sense to get through with the foot fx before changing anything.  D/w pt.  She usually has menses q6 months, but had some breakthrough bleeding prev that was minimal.  When she did have have time off pills for scheduled menses, she had nightsweats and a heavy period.  She may be perimenopausal.  She doesn't have an aura.  She is back to baseline now w/o breakthrough bleeding.  The goal was to get her through menopause and then gradually taper off OCPs.  She agrees.     She is going to f/u with ortho about her L foot fx.  Still in boot, no pain now.    PMH and SH reviewed  Meds, vitals, and allergies reviewed.   ROS: See HPI.  Otherwise negative.    GEN: nad, alert and oriented HEENT: mucous membranes moist NECK: supple w/o LA CV: rrr. PULM: ctab, no inc wob ABD: soft, +bs EXT: no edema SKIN: no acute rash Foot in boot per ortho.

## 2016-03-15 NOTE — Patient Instructions (Addendum)
You can call for a mammogram at: [facility] at [facility].  [address] [place] [phone]  I'll await the ortho notes.   Don't change your meds for now.  Take care.  Glad to see you.

## 2016-03-18 NOTE — Assessment & Plan Note (Signed)
Seeing Dr. Neale BurlyFreeman re: migraines with zonisamide added.  Still on long course OCPs to limit menses and migraines.  It makes sense to get through with the foot fx before changing anything.  D/w pt.  She usually has menses q6 months, but had some breakthrough bleeding prev that was minimal.  When she did have have time off pills for scheduled menses, she had nightsweats and a heavy period.  She may be perimenopausal.  She doesn't have an aura.  She is back to baseline now w/o breakthrough bleeding.  The goal was to get her through menopause and then gradually taper off OCPs.  She agrees.   Continue as is for now. She'll update me as needed.  Risk < benefit from OCPs given her response with migraines frequency.

## 2016-03-18 NOTE — Assessment & Plan Note (Signed)
Tetanus 2013 Flu encouraged, prev done fall 2016  PNA and shingles not due Colon cancer screening not due.  Mammogram due, d/w pt.  Pap not due. D/w pt.   Living will d/w pt. Husband designated if patient were incapacitated.  DXA- done recently per ortho- she has f/u pending.   Diet and exercise d/w pt. Working on both except for limited by foot fx.  [facility] since [facility].

## 2016-03-22 ENCOUNTER — Ambulatory Visit
Admission: RE | Admit: 2016-03-22 | Discharge: 2016-03-22 | Disposition: A | Discharge status: Home / Self Care | Payer: BLUE CROSS/BLUE SHIELD | Source: Ambulatory Visit | Attending: Family Medicine | Admitting: Family Medicine

## 2016-03-22 DIAGNOSIS — Z1231 Encounter for screening mammogram for malignant neoplasm of breast: Secondary | ICD-10-CM | POA: Diagnosis not present

## 2016-04-02 DIAGNOSIS — G43019 Migraine without aura, intractable, without status migrainosus: Secondary | ICD-10-CM | POA: Diagnosis not present

## 2016-04-02 DIAGNOSIS — M791 Myalgia: Secondary | ICD-10-CM | POA: Diagnosis not present

## 2016-04-02 DIAGNOSIS — G518 Other disorders of facial nerve: Secondary | ICD-10-CM | POA: Diagnosis not present

## 2016-04-02 DIAGNOSIS — G43719 Chronic migraine without aura, intractable, without status migrainosus: Secondary | ICD-10-CM | POA: Diagnosis not present

## 2016-04-02 DIAGNOSIS — M542 Cervicalgia: Secondary | ICD-10-CM | POA: Diagnosis not present

## 2016-04-04 ENCOUNTER — Encounter: Payer: Self-pay | Admitting: Family Medicine

## 2016-04-04 DIAGNOSIS — S92355D Nondisplaced fracture of fifth metatarsal bone, left foot, subsequent encounter for fracture with routine healing: Secondary | ICD-10-CM | POA: Diagnosis not present

## 2016-04-04 LAB — GLUCOSE (CC13)
ALT: 16
AST: 20 U/L
Cholesterol, Total: 209
Creatinine, Ser: 0.72
GLUCOSE: 96
HDL Cholesterol: 104
LDL CALC: 91 mg/dL
TRIGLYCERIDES: 70
TSH: 1.37
Vit D, 25-Hydroxy: 65

## 2016-04-09 DIAGNOSIS — S92355D Nondisplaced fracture of fifth metatarsal bone, left foot, subsequent encounter for fracture with routine healing: Secondary | ICD-10-CM | POA: Diagnosis not present

## 2016-04-17 DIAGNOSIS — M791 Myalgia: Secondary | ICD-10-CM | POA: Diagnosis not present

## 2016-04-17 DIAGNOSIS — G43019 Migraine without aura, intractable, without status migrainosus: Secondary | ICD-10-CM | POA: Diagnosis not present

## 2016-04-17 DIAGNOSIS — G43719 Chronic migraine without aura, intractable, without status migrainosus: Secondary | ICD-10-CM | POA: Diagnosis not present

## 2016-04-17 DIAGNOSIS — M542 Cervicalgia: Secondary | ICD-10-CM | POA: Diagnosis not present

## 2016-04-17 DIAGNOSIS — G518 Other disorders of facial nerve: Secondary | ICD-10-CM | POA: Diagnosis not present

## 2016-04-23 DIAGNOSIS — S92355D Nondisplaced fracture of fifth metatarsal bone, left foot, subsequent encounter for fracture with routine healing: Secondary | ICD-10-CM | POA: Diagnosis not present

## 2016-05-02 DIAGNOSIS — G518 Other disorders of facial nerve: Secondary | ICD-10-CM | POA: Diagnosis not present

## 2016-05-02 DIAGNOSIS — M542 Cervicalgia: Secondary | ICD-10-CM | POA: Diagnosis not present

## 2016-05-02 DIAGNOSIS — M791 Myalgia: Secondary | ICD-10-CM | POA: Diagnosis not present

## 2016-05-02 DIAGNOSIS — G43019 Migraine without aura, intractable, without status migrainosus: Secondary | ICD-10-CM | POA: Diagnosis not present

## 2016-05-02 DIAGNOSIS — G43719 Chronic migraine without aura, intractable, without status migrainosus: Secondary | ICD-10-CM | POA: Diagnosis not present

## 2016-05-08 DIAGNOSIS — R1013 Epigastric pain: Secondary | ICD-10-CM | POA: Diagnosis not present

## 2016-05-09 DIAGNOSIS — S92355D Nondisplaced fracture of fifth metatarsal bone, left foot, subsequent encounter for fracture with routine healing: Secondary | ICD-10-CM | POA: Diagnosis not present

## 2016-05-21 DIAGNOSIS — G43019 Migraine without aura, intractable, without status migrainosus: Secondary | ICD-10-CM | POA: Diagnosis not present

## 2016-05-21 DIAGNOSIS — M791 Myalgia: Secondary | ICD-10-CM | POA: Diagnosis not present

## 2016-05-21 DIAGNOSIS — G43719 Chronic migraine without aura, intractable, without status migrainosus: Secondary | ICD-10-CM | POA: Diagnosis not present

## 2016-05-21 DIAGNOSIS — M542 Cervicalgia: Secondary | ICD-10-CM | POA: Diagnosis not present

## 2016-05-21 DIAGNOSIS — G518 Other disorders of facial nerve: Secondary | ICD-10-CM | POA: Diagnosis not present

## 2016-05-22 DIAGNOSIS — M25572 Pain in left ankle and joints of left foot: Secondary | ICD-10-CM | POA: Diagnosis not present

## 2016-06-03 DIAGNOSIS — S92355D Nondisplaced fracture of fifth metatarsal bone, left foot, subsequent encounter for fracture with routine healing: Secondary | ICD-10-CM | POA: Diagnosis not present

## 2016-06-19 DIAGNOSIS — G43019 Migraine without aura, intractable, without status migrainosus: Secondary | ICD-10-CM | POA: Diagnosis not present

## 2016-06-19 DIAGNOSIS — G43719 Chronic migraine without aura, intractable, without status migrainosus: Secondary | ICD-10-CM | POA: Diagnosis not present

## 2016-08-01 DIAGNOSIS — G43719 Chronic migraine without aura, intractable, without status migrainosus: Secondary | ICD-10-CM | POA: Diagnosis not present

## 2016-08-01 DIAGNOSIS — G43019 Migraine without aura, intractable, without status migrainosus: Secondary | ICD-10-CM | POA: Diagnosis not present

## 2016-08-16 ENCOUNTER — Ambulatory Visit: Payer: BLUE CROSS/BLUE SHIELD | Admitting: Family Medicine

## 2016-08-21 ENCOUNTER — Ambulatory Visit (INDEPENDENT_AMBULATORY_CARE_PROVIDER_SITE_OTHER): Payer: BLUE CROSS/BLUE SHIELD | Admitting: Internal Medicine

## 2016-08-21 ENCOUNTER — Encounter: Payer: Self-pay | Admitting: Internal Medicine

## 2016-08-21 VITALS — BP 98/60 | HR 89 | Temp 98.5°F | Wt 124.0 lb

## 2016-08-21 DIAGNOSIS — G43829 Menstrual migraine, not intractable, without status migrainosus: Secondary | ICD-10-CM | POA: Diagnosis not present

## 2016-08-21 DIAGNOSIS — N943 Premenstrual tension syndrome: Secondary | ICD-10-CM

## 2016-08-21 NOTE — Patient Instructions (Signed)

## 2016-08-21 NOTE — Addendum Note (Signed)
Addended by: Roena MaladyEVONTENNO, MELANIE Y on: 08/21/2016 02:34 PM   Modules accepted: Orders

## 2016-08-21 NOTE — Progress Notes (Signed)
Subjective:    Patient ID: Margaret Brewer, female    DOB: 05/08/1966, 50 y.o.   MRN: [id number]  HPI  Pt presents to the clinic today with c/o a migraine. She reports this started 10 days ago after having a break in her OCP's for a biannual menstrual cycle. She reports her neurologist is also switching her preventative medications, cutting out the Gabapentin and increasing the Zonegran. The pain moves around, but today it is located in the right side of her head. She describes the pain as a constant throbbing. She denies visual changes, dizziness, sensitivity to sound, nausea or vomiting. She does have some mild sensitivity to light. She has tried Zomig but reports it only provides temporary relief. She called her neurologist Dr. Neale BurlyFreeman, but he is currently out of the office. His nurse advised her to try Diclofenac but she reports that did not help.  Review of Systems      Past Medical History:  Diagnosis Date  . Hyperlipidemia    with high HDL  . Metatarsal fracture   . Migraine without aura    varies with hormonal changes and weather changes    Current Outpatient Prescriptions  Medication Sig Dispense Refill  . Calcium Carb-Cholecalciferol 600-400 MG-UNIT TABS Take 1 tablet by mouth daily.    . ciprofloxacin (CIPRO) 250 MG tablet Take one dose after intercourse 30 tablet 3  . desogestrel-ethinyl estradiol (APRI,EMOQUETTE,SOLIA) 0.15-30 MG-MCG tablet Take 1 tablet by mouth daily. 3 Package 3  . fish oil-omega-3 fatty acids 1000 MG capsule Take 1 g by mouth daily.    Marland Kitchen. gabapentin (NEURONTIN) 600 MG tablet Take one by mouth every am, two at lunch and two at bedtime    . Multiple Vitamin (MULTIVITAMIN) tablet Take 1 tablet by mouth daily.    . ranitidine (ZANTAC) 150 MG capsule Take 150 mg by mouth 2 (two) times daily.     . vitamin C (ASCORBIC ACID) 500 MG tablet Take 500 mg by mouth 2 (two) times daily.    . Vitamin D, Ergocalciferol, (DRISDOL) 50000 units CAPS capsule Take  50,000 Units by mouth every 7 (seven) days.    Marland Kitchen. zolmitriptan (ZOMIG) 5 MG tablet Take 5 mg by mouth as needed.    . zonisamide (ZONEGRAN) 25 MG capsule Take 25 mg by mouth daily.     No current facility-administered medications for this visit.     Allergies  Allergen Reactions  . Cephalexin     rash  . Erythromycin     GI upset  . Nitrofurantoin     rash  . Omeprazole Other (See Comments)    Migraines worse on med  . Penicillins     rash  . Sulfonamide Derivatives     hives    Family History  Problem Relation Age of Onset  . Osteoporosis    . Osteoporosis Mother   . Diabetes Neg Hx   . Colon cancer Neg Hx   . Breast cancer Neg Hx   . Heart disease Other   . Hypertension Other   . Stroke Other     X 2    Social History   Social History  . Marital status: Married    Spouse name: N/A  . Number of children: 0  . Years of education: N/A   Occupational History  . Asst. Dir of [employer]   Social History Main Topics  . Smoking status: Never Smoker  . Smokeless tobacco: Never Used  .  Alcohol use 0.0 oz/week     Comment: occ, wine  . Drug use: No  . Sexual activity: Not on file   Other Topics Concern  . Not on file   Social History Narrative   Married 1992   [employer] grad   No kids   Working at [employer], Teacher, adult education.       Constitutional: Pt reports headache. Denies fever, malaise, fatigue, or abrupt weight changes.  HEENT: Denies eye pain, eye redness, ear pain, ringing in the ears, wax buildup, runny nose, nasal congestion, bloody nose, or sore throat. Neurological: Denies dizziness, difficulty with memory, difficulty with speech or problems with balance and coordination.    No other specific complaints in a complete review of systems (except as listed in HPI above).  Objective:   Physical Exam   BP 98/60   Pulse 89   Temp 98.5 F (36.9 C) (Oral)   Wt 124 lb (56.2 kg)   SpO2 99%   BMI 23.43 kg/m  Wt Readings  from Last 3 Encounters:  08/21/16 124 lb (56.2 kg)  03/15/16 125 lb 12 oz (57 kg)  02/28/16 126 lb 12 oz (57.5 kg)    General: Appears her stated age, well developed, well nourished in NAD. HEENT: Head: normal shape and size; Eyes: sclera white, no icterus, conjunctiva pink, PERRLA and EOMs intact;  Neurological: Alert and oriented. Coordination normal.   BMET    Component Value Date/Time   NA 136 02/23/2015 0905   K 4.2 02/23/2015 0905   CL 107 02/23/2015 0905   CO2 25 02/23/2015 0905   GLUCOSE 90 02/23/2015 0905   BUN 15 02/23/2015 0905   CREATININE 0.72 03/02/2016   CREATININE 0.76 05/13/2014 1544   CALCIUM 9.7 02/23/2015 0905   GFRNONAA 83.14 01/26/2010 0904   GFRAA 101 01/25/2009 0839    Lipid Panel     Component Value Date/Time   CHOL 209 03/02/2016   TRIG 70 03/02/2016   HDL 104 03/02/2016   CHOLHDL 2 02/09/2013 0850   VLDL 15.6 02/09/2013 0850   LDLCALC 91 03/02/2016    CBC    Component Value Date/Time   WBC 6.2 03/08/2015 1823   RBC 4.30 03/08/2015 1823   HGB 13.3 03/08/2015 1823   HCT 38.7 03/08/2015 1823   PLT 272.0 03/08/2015 1823   MCV 89.9 03/08/2015 1823   MCH 30.3 05/13/2014 1544   MCHC 34.5 03/08/2015 1823   RDW 13.0 03/08/2015 1823   LYMPHSABS 2.5 03/08/2015 1823   MONOABS 0.3 03/08/2015 1823   EOSABS 0.2 03/08/2015 1823   BASOSABS 0.0 03/08/2015 1823    Hgb A1C No results found for: HGBA1C         Assessment & Plan:   Migraine:  Continue preventative and abortive medications as directed by Dr. Neale Burly Imitrex IM today  RTC as needed or if symptoms persist or worsen Nicki Reaper, NP

## 2016-08-22 ENCOUNTER — Encounter: Payer: Self-pay | Admitting: Family Medicine

## 2016-08-22 ENCOUNTER — Ambulatory Visit (INDEPENDENT_AMBULATORY_CARE_PROVIDER_SITE_OTHER): Payer: BLUE CROSS/BLUE SHIELD | Admitting: Family Medicine

## 2016-08-22 DIAGNOSIS — G43009 Migraine without aura, not intractable, without status migrainosus: Secondary | ICD-10-CM | POA: Diagnosis not present

## 2016-08-22 MED ORDER — PROMETHAZINE HCL 25 MG PO TABS: 12.5000 mg | ORAL_TABLET | Freq: Three times a day (TID) | ORAL | 0 refills | Status: DC | PRN

## 2016-08-22 MED ORDER — CYCLOBENZAPRINE HCL 10 MG PO TABS: 5.0000 mg | ORAL_TABLET | Freq: Three times a day (TID) | ORAL | 0 refills | Status: DC | PRN

## 2016-08-22 MED ORDER — HYDROCODONE-ACETAMINOPHEN 5-325 MG PO TABS: 1.0000 | ORAL_TABLET | Freq: Four times a day (QID) | ORAL | 0 refills | Status: DC | PRN

## 2016-08-22 MED ORDER — PREDNISONE 10 MG PO TABS: 10.0000 mg | ORAL_TABLET | Freq: Every day | ORAL | 0 refills | Status: DC

## 2016-08-22 NOTE — Progress Notes (Signed)
Pre visit review using our clinic review tool, if applicable. No additional management support is needed unless otherwise documented below in the visit note. 

## 2016-08-22 NOTE — Progress Notes (Signed)
Menses last week.  Now with migraine.  Still with zomig, recently used- and still on zonegran at baseline.    Migraine for the last 11 days.  Tried mult doses of zomig, only partial relief.     Throbbing HA, photo and phonophobia.  Nausea.    Has tapered off gabapentin in the meantime per neuro.  Out of work recently.  Failed tx with IM imitrex.   Failed tx with diclofenac.    Meds, vitals, and allergies reviewed.   ROS: Per HPI unless specifically indicated in ROS section   GEN: nad, alert and oriented HEENT: mucous membranes moist NECK: supple w/o LA CV: rrr.  PULM: ctab, no inc wob ABD: soft, +bs EXT: no edema

## 2016-08-22 NOTE — Patient Instructions (Signed)
Start flexeril and phenergan now.  If needed, add on prednisone.  Use vicodin as last option.  Take care.  Glad to see you.  Rest and fluids in the meantime.  Don't use zomig for now.

## 2016-08-23 MED ORDER — SUMATRIPTAN SUCCINATE 6 MG/0.5ML ~~LOC~~ SOLN
6.0000 mg | Freq: Once | SUBCUTANEOUS | Status: AC
  Administered 2016-08-21: 6 mg via SUBCUTANEOUS

## 2016-08-23 NOTE — Addendum Note (Signed)
Addended by: Roena MaladyEVONTENNO, Rondy Krupinski Y on: 08/23/2016 09:47 AM   Modules accepted: Orders

## 2016-08-23 NOTE — Assessment & Plan Note (Signed)
Unfortunately with daily and continued symptoms. Has failed treatment with Zometa and Imitrex. Would not use either this point. Continue baseline medications. Add on Phenergan and Flexeril with routine cautions. If she does not get any relief with that she can try taking prednisone 10-20 mg a day with food for the next 3 days. If still with refractory pain after that she can use Vicodin as needed. I realized the Vicodin would not be the first-line treatment for a migraine, this was discussed with patient. She agrees. At this point she has tried multiple other medications and exhausted the initial drug choices. At this point still okay for outpatient follow-up. She does not have any focal neurologic symptoms that would suggest need for imaging. It was likely that her symptoms are related to recent weather changes, medication changes, and her menstrual cycle. She agrees.

## 2016-09-17 DIAGNOSIS — G43719 Chronic migraine without aura, intractable, without status migrainosus: Secondary | ICD-10-CM | POA: Diagnosis not present

## 2016-09-17 DIAGNOSIS — G43019 Migraine without aura, intractable, without status migrainosus: Secondary | ICD-10-CM | POA: Diagnosis not present

## 2016-09-29 IMAGING — MG MM DIGITAL SCREENING BILAT W/ CAD
4 series · 4 of 4 positions shown · non-contrast
Comparison: Previous exam(s).

CLINICAL DATA: Screening.

EXAM:
DIGITAL SCREENING BILATERAL MAMMOGRAM WITH CAD

[L MLO]
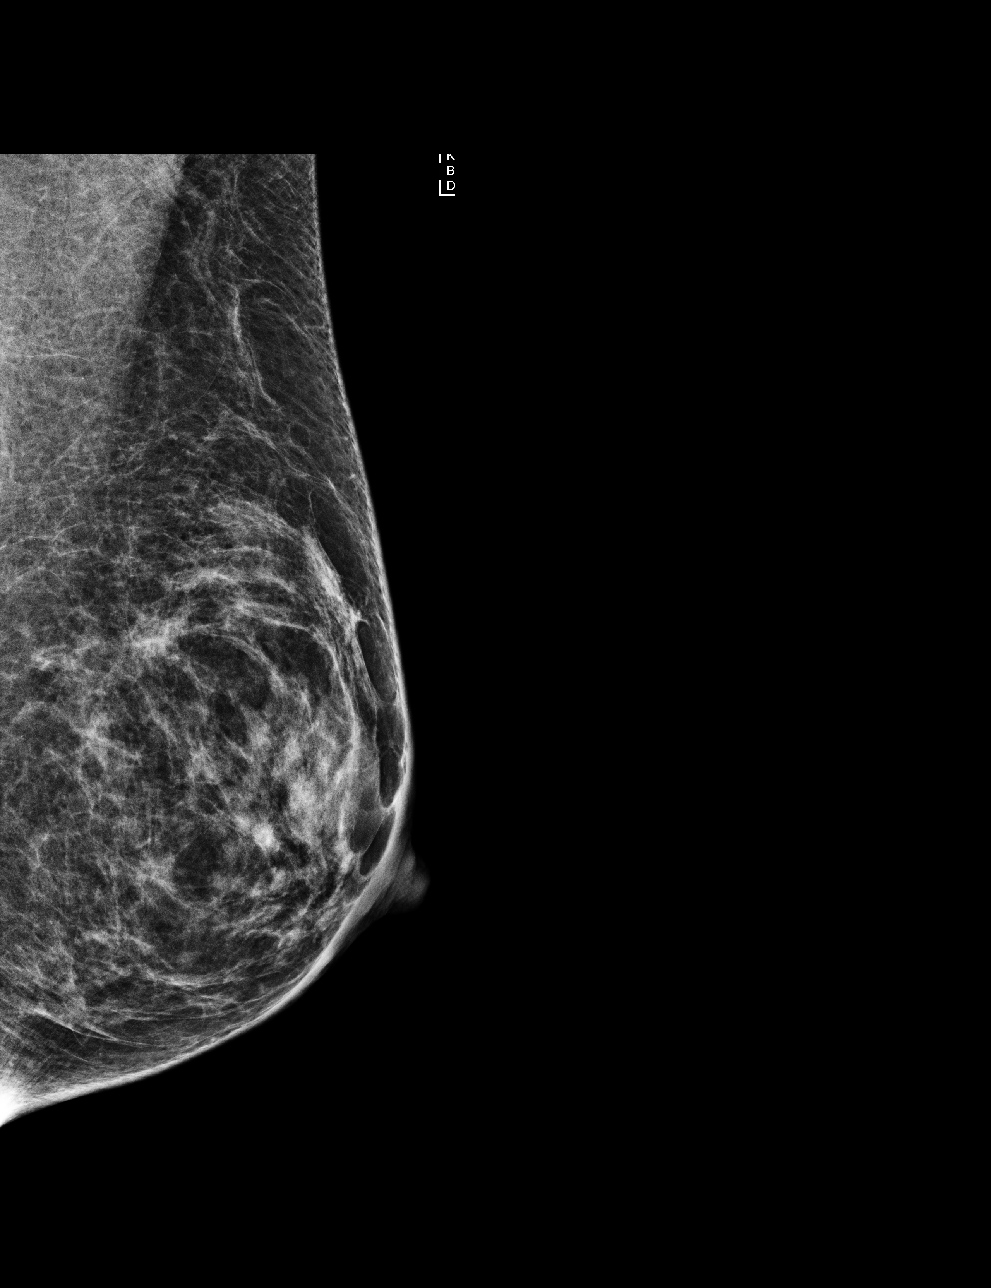

[R MLO]
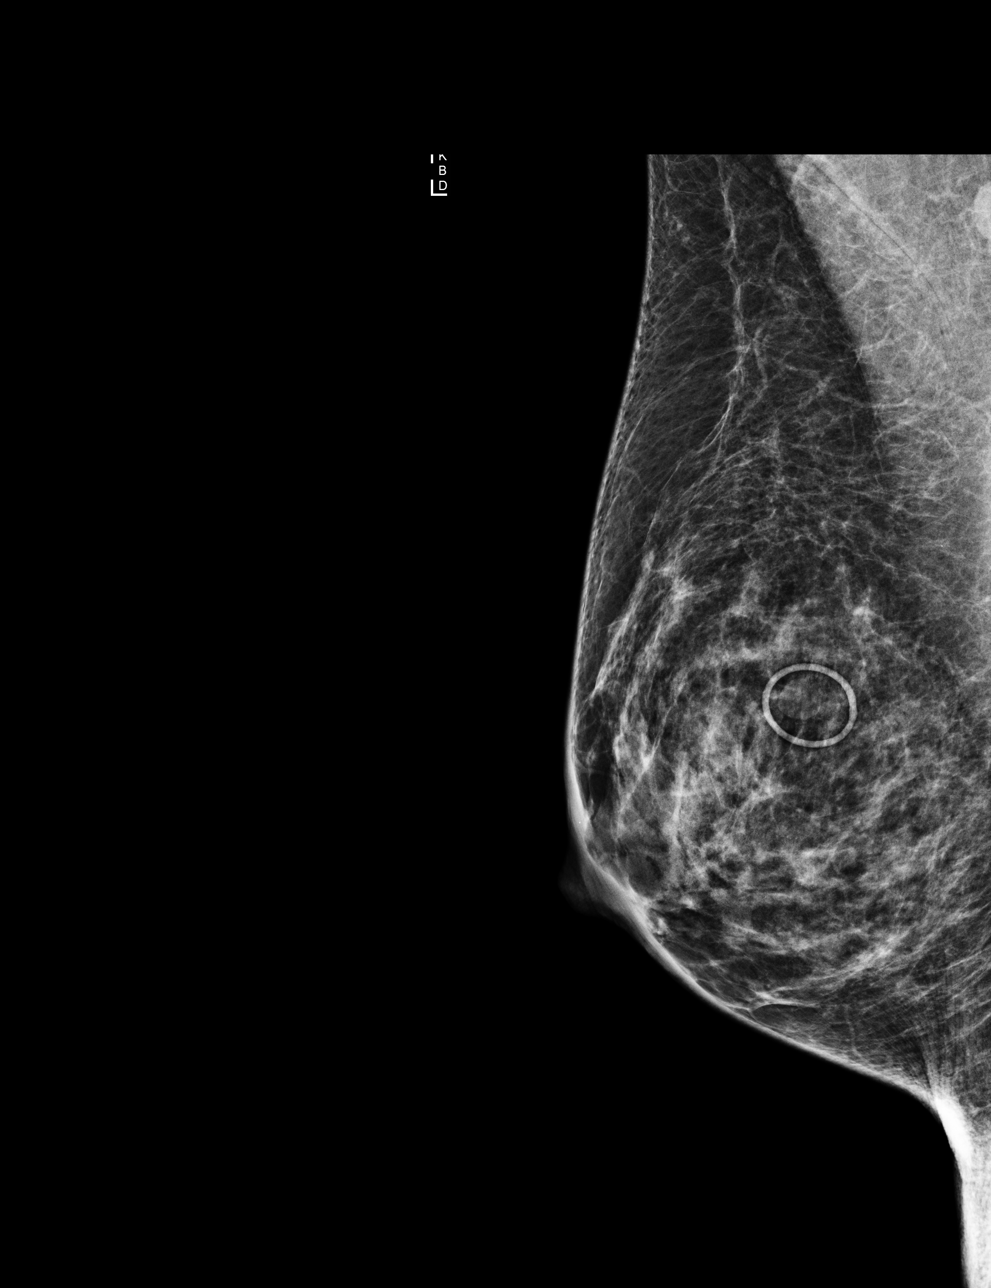

[L CC]
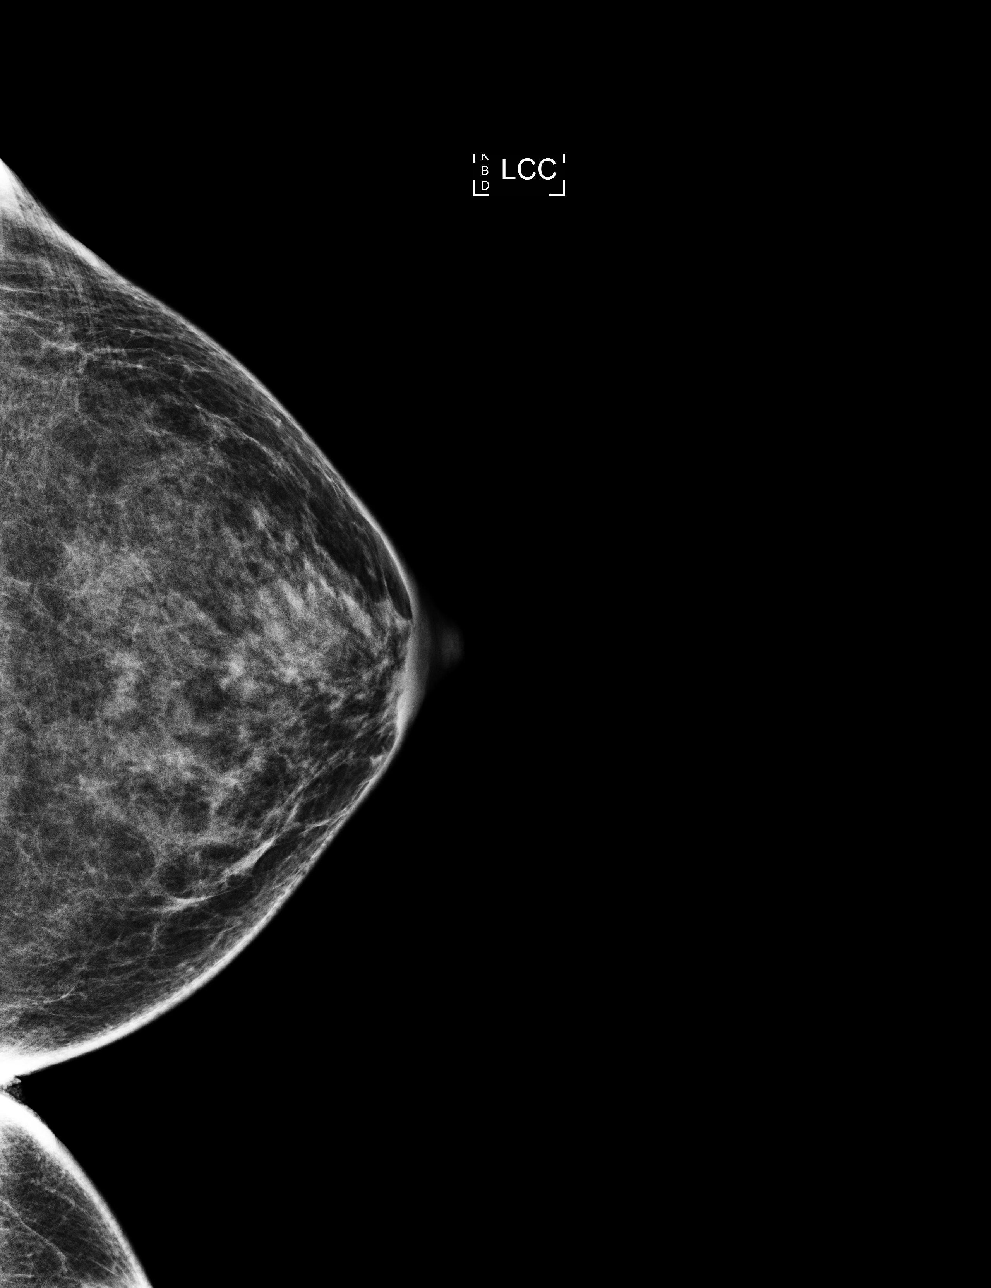

[R CC]
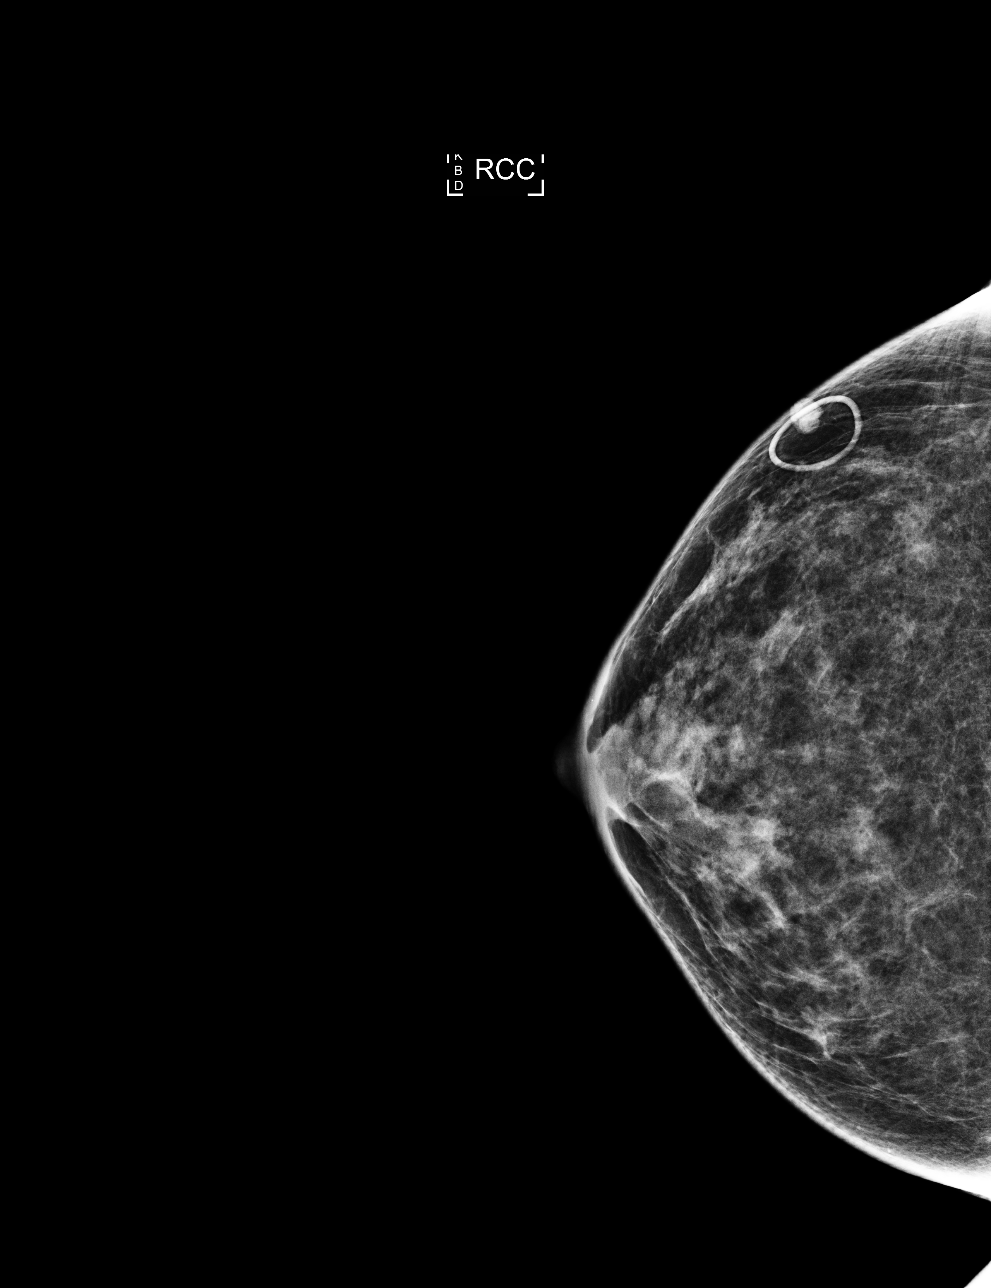

[4 of 4 positions shown; findings below may reference images not displayed]

ACR Breast Density Category b: There are scattered areas of
fibroglandular density.
FINDINGS: There are no findings suspicious for malignancy. Images were
processed with CAD.
IMPRESSION: No mammographic evidence of malignancy. A result letter of this
screening mammogram will be mailed directly to the patient.

RECOMMENDATION:
Screening mammogram in one year. (Code:AS-G-LCT)

BI-RADS CATEGORY  1: Negative.

## 2016-10-01 ENCOUNTER — Encounter: Payer: Self-pay | Admitting: Family Medicine

## 2016-10-01 ENCOUNTER — Ambulatory Visit (INDEPENDENT_AMBULATORY_CARE_PROVIDER_SITE_OTHER): Payer: BLUE CROSS/BLUE SHIELD | Admitting: Family Medicine

## 2016-10-01 DIAGNOSIS — J069 Acute upper respiratory infection, unspecified: Secondary | ICD-10-CM

## 2016-10-01 DIAGNOSIS — B9789 Other viral agents as the cause of diseases classified elsewhere: Secondary | ICD-10-CM | POA: Diagnosis not present

## 2016-10-01 MED ORDER — BENZONATATE 200 MG PO CAPS: 200.0000 mg | ORAL_CAPSULE | Freq: Two times a day (BID) | ORAL | 1 refills | Status: DC | PRN

## 2016-10-01 NOTE — Progress Notes (Signed)
Pre visit review using our clinic review tool, if applicable. No additional management support is needed unless otherwise documented below in the visit note. 

## 2016-10-01 NOTE — Progress Notes (Signed)
After the last OV she was clearly better after flexeril and promethazine.  She was able to sleep that night.  She took the prednisone course.  The next day she didn't have a headache.  She had f/u in the meantime with neuro.    She had to go to two funerals last week.  I offered my condolences.    Sx started about 6 days ago.  Initially just a cough.  Started otc meds that night.  Cough continued, then some ST.  Over the weekend, she felt worse in general- fatigued, chills, but no fevers.  She felt worse last night.  No help with otc cough meds last night.  Dry cough.  No sputum. Minimal rhinorrhea.  No ear pain now, but prev felt crackling in the ears.  The cough feels like it originates in the throat.   She got a rash from using vicks on her chest.    Meds, vitals, and allergies reviewed.   ROS: Per HPI unless specifically indicated in ROS section   GEN: nad, alert and oriented HEENT: mucous membranes moist, tm w/o erythema, nasal exam w/o erythema, scant clear discharge noted,  OP with cobblestoning NECK: supple w/o LA CV: rrr.   PULM: ctab, no inc wob EXT: no edema SKIN: blanching rash on the chest.

## 2016-10-01 NOTE — Patient Instructions (Signed)
Rest and fluids.  Tessalon for cough.  Gargle with salt water if needed.  Update us at the end of the week if worse.  Take care.  Glad to see you.

## 2016-10-02 ENCOUNTER — Telehealth: Payer: Self-pay

## 2016-10-02 DIAGNOSIS — J069 Acute upper respiratory infection, unspecified: Secondary | ICD-10-CM | POA: Insufficient documentation

## 2016-10-02 MED ORDER — GUAIFENESIN-CODEINE 100-10 MG/5ML PO SYRP: 5.0000 mL | ORAL_SOLUTION | Freq: Three times a day (TID) | ORAL | 0 refills | Status: DC | PRN

## 2016-10-02 NOTE — Telephone Encounter (Signed)
Pt left v/m;pt seen 10/01/16 and tessalon is not helping cough; pt could not sleep last night due to cough and pt has coughed a lot today. Pt request different med for cough and request cb. [facility].

## 2016-10-02 NOTE — Assessment & Plan Note (Signed)
Nontoxic. Rest and fluids.  Tessalon for cough.  Gargle with salt water if needed.  Update us at the end of the week if worse. She agrees.   Additionally she has had some lateral right upper arm pain and some occasional right thumb numbness. These seem to be related. No trauma, but she questions if she slept awkwardly one night and if this could've caused her symptoms. She doesn't have any weakness. She does not have consistent pain. It does seem to be that some of her symptoms could be positional, based on the way she describes her symptoms. Discussed with the patient about routine neck exercises and following up if not improved. She agrees.

## 2016-10-02 NOTE — Telephone Encounter (Signed)
Per patient script was called to [facility] as instructed.

## 2016-10-02 NOTE — Telephone Encounter (Signed)
I'm still seeing patients and just getting to this.  Okay to call in cheratussin, verify pharmacy of choice since after hours and [facility] is closed.  Thanks.

## 2016-10-07 ENCOUNTER — Telehealth: Payer: Self-pay

## 2016-10-07 NOTE — Telephone Encounter (Signed)
Pt was here last week was told if not feeling better to come back by the end of the week she said by Friday she was feeling better but then over the weekend she went back to coughing and congested. She would like to know if there's anything else that Dr, Para Marchuncan can do for her is there another reason that she needs to come back to see him? She was coughing last night and couldn't go to sleep.

## 2016-10-08 NOTE — Telephone Encounter (Signed)
Did the cough medicine help at all? Has she had any fevers? Any facial pain? Does she have productive cough with discolored sputum? Let me know and we'll go from there. Thanks.

## 2016-10-08 NOTE — Telephone Encounter (Signed)
Patient says the cough medicine helps for about 4 hours rather than 8 hours but she spoke with her pharmacist last evening and was told that she could use the cough med along with the Tessalon.  She did that last night and had a better night's sleep.  No fevers, no facial pain, no productive cough.  Patient will call if not continuing to improve.

## 2016-10-08 NOTE — Telephone Encounter (Signed)
Thanks

## 2016-10-17 ENCOUNTER — Other Ambulatory Visit: Payer: Self-pay

## 2016-10-17 MED ORDER — GUAIFENESIN-CODEINE 100-10 MG/5ML PO SYRP: 5.0000 mL | ORAL_SOLUTION | Freq: Three times a day (TID) | ORAL | 0 refills | Status: DC | PRN

## 2016-10-17 NOTE — Telephone Encounter (Signed)
Pt left v/m; pt seen 10/01/16; pt has finished the cheratussin for cough and last night pt could not rest due to coughing. Pt request refill to [facility] [address] or does pt need to have another appt. Pt request cb.

## 2016-10-17 NOTE — Telephone Encounter (Signed)
If gradually getting some better, then okay to refill/call in.  If getting worse overall, then needs recheck.  Thanks.

## 2016-10-17 NOTE — Telephone Encounter (Signed)
Medication phoned to pharmacy.  

## 2016-10-17 NOTE — Telephone Encounter (Signed)
Then I would continue the med, please call in. F/u if not better at the end of the rx.  Thanks.

## 2016-10-17 NOTE — Telephone Encounter (Signed)
Patient says she may be just a little better, certainly not worse but she definitely still has it.  She says she has tried going without the Tessalon in the daytime and the Cheratussin at night and she starts right back coughing.  Please advise.

## 2016-10-31 DIAGNOSIS — G43719 Chronic migraine without aura, intractable, without status migrainosus: Secondary | ICD-10-CM | POA: Diagnosis not present

## 2016-11-13 DIAGNOSIS — G43019 Migraine without aura, intractable, without status migrainosus: Secondary | ICD-10-CM | POA: Diagnosis not present

## 2016-11-13 DIAGNOSIS — G43719 Chronic migraine without aura, intractable, without status migrainosus: Secondary | ICD-10-CM | POA: Diagnosis not present

## 2016-12-23 DIAGNOSIS — G43019 Migraine without aura, intractable, without status migrainosus: Secondary | ICD-10-CM | POA: Diagnosis not present

## 2016-12-23 DIAGNOSIS — G43719 Chronic migraine without aura, intractable, without status migrainosus: Secondary | ICD-10-CM | POA: Diagnosis not present

## 2017-01-28 ENCOUNTER — Other Ambulatory Visit: Payer: Self-pay | Admitting: *Deleted

## 2017-01-28 MED ORDER — DESOGESTREL-ETHINYL ESTRADIOL 0.15-30 MG-MCG PO TABS: 1.0000 | ORAL_TABLET | Freq: Every day | ORAL | 0 refills | Status: DC

## 2017-01-28 NOTE — Telephone Encounter (Signed)
Received faxed refill request from pharmacy. Refill sent in electronically. 

## 2017-01-30 DIAGNOSIS — M542 Cervicalgia: Secondary | ICD-10-CM | POA: Diagnosis not present

## 2017-01-30 DIAGNOSIS — G43019 Migraine without aura, intractable, without status migrainosus: Secondary | ICD-10-CM | POA: Diagnosis not present

## 2017-01-30 DIAGNOSIS — M791 Myalgia: Secondary | ICD-10-CM | POA: Diagnosis not present

## 2017-01-30 DIAGNOSIS — G43719 Chronic migraine without aura, intractable, without status migrainosus: Secondary | ICD-10-CM | POA: Diagnosis not present

## 2017-01-30 DIAGNOSIS — G518 Other disorders of facial nerve: Secondary | ICD-10-CM | POA: Diagnosis not present

## 2017-01-31 DIAGNOSIS — G43719 Chronic migraine without aura, intractable, without status migrainosus: Secondary | ICD-10-CM | POA: Diagnosis not present

## 2017-02-12 DIAGNOSIS — G43019 Migraine without aura, intractable, without status migrainosus: Secondary | ICD-10-CM | POA: Diagnosis not present

## 2017-02-12 DIAGNOSIS — G43719 Chronic migraine without aura, intractable, without status migrainosus: Secondary | ICD-10-CM | POA: Diagnosis not present

## 2017-03-09 ENCOUNTER — Other Ambulatory Visit: Payer: Self-pay | Admitting: Family Medicine

## 2017-03-09 DIAGNOSIS — R7989 Other specified abnormal findings of blood chemistry: Secondary | ICD-10-CM

## 2017-03-09 DIAGNOSIS — Z8781 Personal history of (healed) traumatic fracture: Secondary | ICD-10-CM

## 2017-03-13 ENCOUNTER — Other Ambulatory Visit (INDEPENDENT_AMBULATORY_CARE_PROVIDER_SITE_OTHER): Payer: BLUE CROSS/BLUE SHIELD

## 2017-03-13 DIAGNOSIS — Z8781 Personal history of (healed) traumatic fracture: Secondary | ICD-10-CM

## 2017-03-13 DIAGNOSIS — R7989 Other specified abnormal findings of blood chemistry: Secondary | ICD-10-CM | POA: Diagnosis not present

## 2017-03-13 LAB — COMPREHENSIVE METABOLIC PANEL
ALBUMIN: 4 g/dL (ref 3.5–5.2)
ALT: 18 U/L (ref 0–35)
AST: 18 U/L (ref 0–37)
Alkaline Phosphatase: 44 U/L (ref 39–117)
BUN: 12 mg/dL (ref 6–23)
CALCIUM: 9.4 mg/dL (ref 8.4–10.5)
CHLORIDE: 100 meq/L (ref 96–112)
CO2: 29 mEq/L (ref 19–32)
Creatinine, Ser: 0.76 mg/dL (ref 0.40–1.20)
GFR: 85.51 mL/min (ref >60.00)
Glucose, Bld: 86 mg/dL (ref 70–99)
POTASSIUM: 5 meq/L (ref 3.5–5.1)
Sodium: 135 mEq/L (ref 135–145)
Total Bilirubin: 0.4 mg/dL (ref 0.2–1.2)
Total Protein: 6.7 g/dL (ref 6.0–8.3)

## 2017-03-13 LAB — LIPID PANEL
CHOLESTEROL: 213 mg/dL — AB (ref 0–200)
HDL: 97.6 mg/dL (ref >39.00)
LDL CALC: 103 mg/dL — AB (ref 0–99)
NonHDL: 115.79
TRIGLYCERIDES: 62 mg/dL (ref 0.0–149.0)
Total CHOL/HDL Ratio: 2
VLDL: 12.4 mg/dL (ref 0.0–40.0)

## 2017-03-13 LAB — VITAMIN D 25 HYDROXY (VIT D DEFICIENCY, FRACTURES): VITD: 60.57 ng/mL (ref 30.00–100.00)

## 2017-03-19 ENCOUNTER — Encounter: Payer: Self-pay | Admitting: Family Medicine

## 2017-03-19 ENCOUNTER — Ambulatory Visit (INDEPENDENT_AMBULATORY_CARE_PROVIDER_SITE_OTHER): Payer: BLUE CROSS/BLUE SHIELD | Admitting: Family Medicine

## 2017-03-19 VITALS — BP 100/60 | HR 66 | Temp 98.8°F | Ht 61.0 in | Wt 126.0 lb

## 2017-03-19 DIAGNOSIS — Z0000 Encounter for general adult medical examination without abnormal findings: Secondary | ICD-10-CM

## 2017-03-19 DIAGNOSIS — G43009 Migraine without aura, not intractable, without status migrainosus: Secondary | ICD-10-CM

## 2017-03-19 DIAGNOSIS — Z7189 Other specified counseling: Secondary | ICD-10-CM

## 2017-03-19 DIAGNOSIS — Z1211 Encounter for screening for malignant neoplasm of colon: Secondary | ICD-10-CM

## 2017-03-19 MED ORDER — PROMETHAZINE HCL 25 MG PO TABS: 12.5000 mg | ORAL_TABLET | Freq: Three times a day (TID) | ORAL | 3 refills | Status: DC | PRN

## 2017-03-19 MED ORDER — DESOGESTREL-ETHINYL ESTRADIOL 0.15-30 MG-MCG PO TABS: 1.0000 | ORAL_TABLET | Freq: Every day | ORAL | 3 refills | Status: DC

## 2017-03-19 NOTE — Assessment & Plan Note (Signed)
Improved with Botox. I will defer to neurology. She is due to have her period in the near future. She has 2 periods per year with her current birth control dosing. This is reasonable for now. Hopefully if her Botox continues to be effective will be able to eventually taper off and then discontinue her hormonal treatment. Discussed with patient. She agrees. Continue as is for now.

## 2017-03-19 NOTE — Patient Instructions (Addendum)
You can call for a mammogram at: [facility] at [facility].  [address] [place]  Shirlee Limerick will call about your referral. Take care.  Glad to see you.  Update me as needed.

## 2017-03-19 NOTE — Progress Notes (Addendum)
CPE- See plan.  Routine anticipatory guidance given to patient.  See health maintenance.  The possibility exists that previously documented standard health maintenance information may have been brought forward from a previous encounter into this note.  If needed, that same information has been updated to reflect the current situation based on today's encounter.    Tetanus 2013 Flu encouraged PNA and shingles not due D/w patient ZO:XWRUEAV for colon cancer screening, including IFOB vs. colonoscopy.  Risks and benefits of both were discussed and patient voiced understanding.  Pt elects for: colonoscopy  Mammogram due, d/w pt.   Pap not due. D/w pt.  Living will d/w pt. Husband designated if patient were incapacitated.  DXA- done prev per ortho.   Diet and exercise d/w pt. She is working on both now that her foot fx healed.  She is on an exercise bike and doing well.   [facility] since [facility].    She is due for a period in the near future.  Still on 6 month OCPs.  No other recent periods.  She got botox treatment per [facility] clinic and that is helping.  She is on imipramine now and doing better.    PMH and SH reviewed  Meds, vitals, and allergies reviewed.   ROS: Per HPI.  Unless specifically indicated otherwise in HPI, the patient denies:  General: fever. Eyes: acute vision changes ENT: sore throat Cardiovascular: chest pain Respiratory: SOB GI: vomiting GU: dysuria Musculoskeletal: acute back pain Derm: acute rash Neuro: acute motor dysfunction Psych: worsening mood Endocrine: polydipsia Heme: bleeding Allergy: hayfever  GEN: nad, alert and oriented HEENT: mucous membranes moist NECK: supple w/o LA CV: rrr. PULM: ctab, no inc wob ABD: soft, +bs EXT: no edema SKIN: no acute rash

## 2017-03-19 NOTE — Assessment & Plan Note (Addendum)
Tetanus 2013 Flu encouraged PNA and shingles not due D/w patient ZO:XWRUEAV for colon cancer screening, including IFOB vs. colonoscopy.  Risks and benefits of both were discussed and patient voiced understanding.  Pt elects for: colonoscopy  Mammogram due, d/w pt.   Pap not due. D/w pt.  Living will d/w pt. Husband designated if patient were incapacitated.  DXA- done prev per ortho.   Diet and exercise d/w pt. She is working on both now that her foot fx healed.  She is on an exercise bike and doing well.   [facility] since [facility].

## 2017-03-19 NOTE — Assessment & Plan Note (Signed)
Living will d/w pt.  Husband designated if patient were incapacitated.  

## 2017-04-02 ENCOUNTER — Other Ambulatory Visit: Payer: Self-pay | Admitting: Family Medicine

## 2017-04-02 DIAGNOSIS — Z1231 Encounter for screening mammogram for malignant neoplasm of breast: Secondary | ICD-10-CM

## 2017-04-23 ENCOUNTER — Ambulatory Visit
Admission: RE | Admit: 2017-04-23 | Discharge: 2017-04-23 | Disposition: A | Discharge status: Home / Self Care | Payer: BLUE CROSS/BLUE SHIELD | Source: Ambulatory Visit | Attending: Family Medicine | Admitting: Family Medicine

## 2017-04-23 DIAGNOSIS — Z1231 Encounter for screening mammogram for malignant neoplasm of breast: Secondary | ICD-10-CM | POA: Diagnosis not present

## 2017-04-24 ENCOUNTER — Encounter: Payer: Self-pay | Admitting: *Deleted

## 2017-04-30 DIAGNOSIS — G43719 Chronic migraine without aura, intractable, without status migrainosus: Secondary | ICD-10-CM | POA: Diagnosis not present

## 2017-05-14 DIAGNOSIS — M791 Myalgia: Secondary | ICD-10-CM | POA: Diagnosis not present

## 2017-05-14 DIAGNOSIS — M542 Cervicalgia: Secondary | ICD-10-CM | POA: Diagnosis not present

## 2017-05-14 DIAGNOSIS — G43719 Chronic migraine without aura, intractable, without status migrainosus: Secondary | ICD-10-CM | POA: Diagnosis not present

## 2017-05-14 DIAGNOSIS — G518 Other disorders of facial nerve: Secondary | ICD-10-CM | POA: Diagnosis not present

## 2017-05-14 DIAGNOSIS — G43019 Migraine without aura, intractable, without status migrainosus: Secondary | ICD-10-CM | POA: Diagnosis not present

## 2017-05-15 DIAGNOSIS — Z1211 Encounter for screening for malignant neoplasm of colon: Secondary | ICD-10-CM | POA: Diagnosis not present

## 2017-06-25 DIAGNOSIS — K6389 Other specified diseases of intestine: Secondary | ICD-10-CM | POA: Diagnosis not present

## 2017-06-25 DIAGNOSIS — Z1211 Encounter for screening for malignant neoplasm of colon: Secondary | ICD-10-CM | POA: Diagnosis not present

## 2017-06-25 LAB — HM COLONOSCOPY

## 2017-06-30 DIAGNOSIS — G43719 Chronic migraine without aura, intractable, without status migrainosus: Secondary | ICD-10-CM | POA: Diagnosis not present

## 2017-06-30 DIAGNOSIS — M542 Cervicalgia: Secondary | ICD-10-CM | POA: Diagnosis not present

## 2017-06-30 DIAGNOSIS — G518 Other disorders of facial nerve: Secondary | ICD-10-CM | POA: Diagnosis not present

## 2017-06-30 DIAGNOSIS — G43019 Migraine without aura, intractable, without status migrainosus: Secondary | ICD-10-CM | POA: Diagnosis not present

## 2017-06-30 DIAGNOSIS — M791 Myalgia: Secondary | ICD-10-CM | POA: Diagnosis not present

## 2017-08-02 DIAGNOSIS — G43719 Chronic migraine without aura, intractable, without status migrainosus: Secondary | ICD-10-CM | POA: Diagnosis not present

## 2017-08-13 DIAGNOSIS — G43019 Migraine without aura, intractable, without status migrainosus: Secondary | ICD-10-CM | POA: Diagnosis not present

## 2017-08-13 DIAGNOSIS — G43719 Chronic migraine without aura, intractable, without status migrainosus: Secondary | ICD-10-CM | POA: Diagnosis not present

## 2017-08-13 DIAGNOSIS — M791 Myalgia: Secondary | ICD-10-CM | POA: Diagnosis not present

## 2017-08-13 DIAGNOSIS — M542 Cervicalgia: Secondary | ICD-10-CM | POA: Diagnosis not present

## 2017-08-13 DIAGNOSIS — G518 Other disorders of facial nerve: Secondary | ICD-10-CM | POA: Diagnosis not present

## 2017-09-18 DIAGNOSIS — M542 Cervicalgia: Secondary | ICD-10-CM | POA: Diagnosis not present

## 2017-09-18 DIAGNOSIS — G518 Other disorders of facial nerve: Secondary | ICD-10-CM | POA: Diagnosis not present

## 2017-09-18 DIAGNOSIS — M791 Myalgia, unspecified site: Secondary | ICD-10-CM | POA: Diagnosis not present

## 2017-09-18 DIAGNOSIS — G43019 Migraine without aura, intractable, without status migrainosus: Secondary | ICD-10-CM | POA: Diagnosis not present

## 2017-09-18 DIAGNOSIS — G43719 Chronic migraine without aura, intractable, without status migrainosus: Secondary | ICD-10-CM | POA: Diagnosis not present

## 2017-10-10 DIAGNOSIS — M791 Myalgia, unspecified site: Secondary | ICD-10-CM | POA: Diagnosis not present

## 2017-10-10 DIAGNOSIS — G518 Other disorders of facial nerve: Secondary | ICD-10-CM | POA: Diagnosis not present

## 2017-10-10 DIAGNOSIS — G43719 Chronic migraine without aura, intractable, without status migrainosus: Secondary | ICD-10-CM | POA: Diagnosis not present

## 2017-10-10 DIAGNOSIS — M542 Cervicalgia: Secondary | ICD-10-CM | POA: Diagnosis not present

## 2017-10-10 DIAGNOSIS — G43019 Migraine without aura, intractable, without status migrainosus: Secondary | ICD-10-CM | POA: Diagnosis not present

## 2017-11-03 DIAGNOSIS — G43719 Chronic migraine without aura, intractable, without status migrainosus: Secondary | ICD-10-CM | POA: Diagnosis not present

## 2017-11-13 DIAGNOSIS — M542 Cervicalgia: Secondary | ICD-10-CM | POA: Diagnosis not present

## 2017-11-13 DIAGNOSIS — G43719 Chronic migraine without aura, intractable, without status migrainosus: Secondary | ICD-10-CM | POA: Diagnosis not present

## 2017-11-13 DIAGNOSIS — G518 Other disorders of facial nerve: Secondary | ICD-10-CM | POA: Diagnosis not present

## 2017-11-13 DIAGNOSIS — G43019 Migraine without aura, intractable, without status migrainosus: Secondary | ICD-10-CM | POA: Diagnosis not present

## 2017-11-13 DIAGNOSIS — M791 Myalgia, unspecified site: Secondary | ICD-10-CM | POA: Diagnosis not present

## 2017-12-17 ENCOUNTER — Encounter: Payer: Self-pay | Admitting: Family Medicine

## 2017-12-17 ENCOUNTER — Ambulatory Visit (INDEPENDENT_AMBULATORY_CARE_PROVIDER_SITE_OTHER)
Admission: RE | Admit: 2017-12-17 | Discharge: 2017-12-17 | Disposition: A | Discharge status: Home / Self Care | Payer: No Typology Code available for payment source | Source: Ambulatory Visit | Attending: Family Medicine | Admitting: Family Medicine

## 2017-12-17 ENCOUNTER — Ambulatory Visit (INDEPENDENT_AMBULATORY_CARE_PROVIDER_SITE_OTHER): Payer: BLUE CROSS/BLUE SHIELD | Admitting: Family Medicine

## 2017-12-17 VITALS — BP 108/70 | HR 91 | Temp 99.4°F | Wt 137.5 lb

## 2017-12-17 DIAGNOSIS — S92354A Nondisplaced fracture of fifth metatarsal bone, right foot, initial encounter for closed fracture: Secondary | ICD-10-CM | POA: Diagnosis not present

## 2017-12-17 DIAGNOSIS — S99921A Unspecified injury of right foot, initial encounter: Secondary | ICD-10-CM | POA: Diagnosis not present

## 2017-12-17 DIAGNOSIS — S62316A Displaced fracture of base of fifth metacarpal bone, right hand, initial encounter for closed fracture: Secondary | ICD-10-CM | POA: Diagnosis not present

## 2017-12-17 NOTE — Assessment & Plan Note (Addendum)
Xray today - see below.

## 2017-12-17 NOTE — Assessment & Plan Note (Signed)
Xray showing nondisplaced proximal 5th metatarsal fracture. Will place in long CAM walker boot and refer to ortho for further management. Pt agrees with plan.  rec against NSAID. May use tylenol. Pt declines stonger pain medication.

## 2017-12-17 NOTE — Patient Instructions (Addendum)
Xray showing right sided 5th metatarsal non displaced fracture - we will place you in CAM walker boot and refer you back to Dr Thurston HoleWainer.  We will call you for appointment.

## 2017-12-17 NOTE — Progress Notes (Signed)
BP 108/70 (BP Location: Right Arm, Patient Position: Sitting, Cuff Size: Normal)   Pulse 91   Temp 99.4 F (37.4 C) (Oral)   Wt 137 lb 8 oz (62.4 kg)   SpO2 100%   BMI 25.98 kg/m    CC: R foot pain Subjective:    Patient ID: Margaret Brewer, female    DOB: 02/19/1966, 52 y.o.   MRN: [id number]  HPI: Margaret RampLynette S Dressel is a 52 y.o. female presenting on 12/17/2017 for Foot Pain (RIGHT FOOT X3DAYS )   DOI: 12/15/2017 Suffered fall while walking into work - fell onto bricks and landed R foot. Swelling of foot since, without bruising, pain dorsal mid foot. She has bruise of left lateral thigh. She also bruised L ribcage - tender with cough, not otherwise.   She has been using ASO brace which helps, as well as icing and elevating foot.  H/o L 5th MT fracture 2 yrs ago after another fall. This healed well. Saw Dr Thurston HoleWainer for this.   Relevant past medical, surgical, family and social history reviewed and updated as indicated. Interim medical history since our last visit reviewed. Allergies and medications reviewed and updated. Outpatient Medications Prior to Visit  Medication Sig Dispense Refill  . Calcium Carb-Cholecalciferol 600-400 MG-UNIT TABS Take 1 tablet by mouth daily.    Marland Kitchen. desogestrel-ethinyl estradiol (APRI,EMOQUETTE,SOLIA) 0.15-30 MG-MCG tablet Take 1 tablet by mouth daily. 3 Package 3  . fish oil-omega-3 fatty acids 1000 MG capsule Take 1 g by mouth daily.    Marland Kitchen. imipramine (TOFRANIL) 50 MG tablet Take 50 mg by mouth at bedtime.    . Multiple Vitamin (MULTIVITAMIN) tablet Take 1 tablet by mouth daily.    . OnabotulinumtoxinA (BOTOX IM) Inject into the muscle every 3 (three) months.    . promethazine (PHENERGAN) 25 MG tablet Take 0.5-1 tablets (12.5-25 mg total) by mouth every 8 (eight) hours as needed for nausea. 30 tablet 3  . ranitidine (ZANTAC) 150 MG capsule Take 150 mg by mouth 2 (two) times daily.     Marland Kitchen. tiZANidine (ZANAFLEX) 2 MG tablet Take 2-4 mg by mouth as  needed (for migraines).    . vitamin C (ASCORBIC ACID) 500 MG tablet Take 500 mg by mouth 2 (two) times daily.    Marland Kitchen. zolmitriptan (ZOMIG) 5 MG tablet Take 5 mg by mouth as needed.     No facility-administered medications prior to visit.      Per HPI unless specifically indicated in ROS section below Review of Systems     Objective:    BP 108/70 (BP Location: Right Arm, Patient Position: Sitting, Cuff Size: Normal)   Pulse 91   Temp 99.4 F (37.4 C) (Oral)   Wt 137 lb 8 oz (62.4 kg)   SpO2 100%   BMI 25.98 kg/m   Wt Readings from Last 3 Encounters:  12/17/17 137 lb 8 oz (62.4 kg)  03/19/17 126 lb (57.2 kg)  10/01/16 125 lb 12 oz (57 kg)    Physical Exam  Constitutional: She is oriented to person, place, and time. She appears well-developed and well-nourished. No distress.  Musculoskeletal: She exhibits tenderness. She exhibits no edema.  2+ DP bilaterally L foot WNL R foot: No pain at malleoli, no pain at base of 5th MT Tender to palpation lateral 5th MT without significant pain along shaft or other metatarsals FROM at ankle without ligament laxity  Neurological: She is alert and oriented to person, place, and time.  Sensation intact  Skin:  Skin is warm and dry. Bruising noted. No rash noted.  L lateral thigh bruising as well as mild bruising of R dorsolateral foot   Psychiatric: She has a normal mood and affect.  Nursing note and vitals reviewed.      Assessment & Plan:   Problem List Items Addressed This Visit    Injury of right foot    Xray today - see below.      Relevant Orders   DG Foot Complete Right   Nondisplaced fracture of fifth metatarsal bone, right foot, initial encounter for closed fracture - Primary    Xray showing nondisplaced proximal 5th metatarsal fracture. Will place in long CAM walker boot and refer to ortho for further management. Pt agrees with plan.  rec against NSAID. May use tylenol. Pt declines stonger pain medication.       Relevant  Orders   Ambulatory referral to Orthopedic Surgery       Follow up plan: Return if symptoms worsen or fail to improve.  Eustaquio Boyden, MD

## 2017-12-30 DIAGNOSIS — M542 Cervicalgia: Secondary | ICD-10-CM | POA: Diagnosis not present

## 2017-12-30 DIAGNOSIS — M791 Myalgia, unspecified site: Secondary | ICD-10-CM | POA: Diagnosis not present

## 2017-12-30 DIAGNOSIS — G43719 Chronic migraine without aura, intractable, without status migrainosus: Secondary | ICD-10-CM | POA: Diagnosis not present

## 2017-12-30 DIAGNOSIS — G43019 Migraine without aura, intractable, without status migrainosus: Secondary | ICD-10-CM | POA: Diagnosis not present

## 2017-12-30 DIAGNOSIS — G518 Other disorders of facial nerve: Secondary | ICD-10-CM | POA: Diagnosis not present

## 2018-01-31 ENCOUNTER — Other Ambulatory Visit: Payer: Self-pay | Admitting: Family Medicine

## 2018-02-02 NOTE — Telephone Encounter (Signed)
Electronic refill request. [facility] Last office visit:   12/17/17 acute Last CPE:  03/19/17 Last Filled:   3 Package 3 03/19/2017  Please advise.

## 2018-02-03 NOTE — Telephone Encounter (Signed)
Sent. Thanks.   

## 2018-02-09 DIAGNOSIS — G43719 Chronic migraine without aura, intractable, without status migrainosus: Secondary | ICD-10-CM | POA: Diagnosis not present

## 2018-02-12 DIAGNOSIS — G43719 Chronic migraine without aura, intractable, without status migrainosus: Secondary | ICD-10-CM | POA: Diagnosis not present

## 2018-02-12 DIAGNOSIS — M542 Cervicalgia: Secondary | ICD-10-CM | POA: Diagnosis not present

## 2018-02-12 DIAGNOSIS — M791 Myalgia, unspecified site: Secondary | ICD-10-CM | POA: Diagnosis not present

## 2018-02-12 DIAGNOSIS — G518 Other disorders of facial nerve: Secondary | ICD-10-CM | POA: Diagnosis not present

## 2018-02-12 DIAGNOSIS — G43019 Migraine without aura, intractable, without status migrainosus: Secondary | ICD-10-CM | POA: Diagnosis not present

## 2018-02-16 ENCOUNTER — Encounter: Payer: Self-pay | Admitting: Family Medicine

## 2018-02-18 ENCOUNTER — Other Ambulatory Visit: Payer: Self-pay | Admitting: Family Medicine

## 2018-02-18 NOTE — Telephone Encounter (Signed)
Electronic refill request Last refill 03/19/17 #30/3 Last office visit 12/17/17/acute

## 2018-02-18 NOTE — Telephone Encounter (Signed)
Spoke to patient and was advised that this is her mail order pharmacy. Patient stated that she does not need this refill and does not understanding why they requested it. Patient stated that she got a refill back in January and did not understanding why. Advised patient that she may be on automatic refills. Patient stated that she will call the pharmacy and tell them to take this off of automatic refills.

## 2018-02-18 NOTE — Telephone Encounter (Signed)
Okay to send as is, but where does she want this sent?  Currently listed as pharmacy in [place].

## 2018-02-19 NOTE — Telephone Encounter (Signed)
Noted. Thanks.

## 2018-03-17 DIAGNOSIS — Z87310 Personal history of (healed) osteoporosis fracture: Secondary | ICD-10-CM | POA: Diagnosis not present

## 2018-03-17 LAB — HM DEXA SCAN

## 2018-03-24 ENCOUNTER — Encounter: Payer: Self-pay | Admitting: Family Medicine

## 2018-03-24 LAB — LAB REPORT - SCANNED
ALT: 16
AST: 22
CREATININE: 0.92
GLUCOSE: 103
HEMOGLOBIN: 12.3
Vitamin D 1, 25 (OH)2 Total: 104

## 2018-03-31 DIAGNOSIS — G518 Other disorders of facial nerve: Secondary | ICD-10-CM | POA: Diagnosis not present

## 2018-03-31 DIAGNOSIS — G43019 Migraine without aura, intractable, without status migrainosus: Secondary | ICD-10-CM | POA: Diagnosis not present

## 2018-03-31 DIAGNOSIS — M542 Cervicalgia: Secondary | ICD-10-CM | POA: Diagnosis not present

## 2018-03-31 DIAGNOSIS — G43719 Chronic migraine without aura, intractable, without status migrainosus: Secondary | ICD-10-CM | POA: Diagnosis not present

## 2018-03-31 DIAGNOSIS — M791 Myalgia, unspecified site: Secondary | ICD-10-CM | POA: Diagnosis not present

## 2018-04-06 ENCOUNTER — Other Ambulatory Visit: Payer: BLUE CROSS/BLUE SHIELD

## 2018-04-13 ENCOUNTER — Other Ambulatory Visit: Payer: Self-pay | Admitting: Family Medicine

## 2018-04-13 ENCOUNTER — Other Ambulatory Visit (INDEPENDENT_AMBULATORY_CARE_PROVIDER_SITE_OTHER): Payer: BLUE CROSS/BLUE SHIELD

## 2018-04-13 DIAGNOSIS — R739 Hyperglycemia, unspecified: Secondary | ICD-10-CM

## 2018-04-13 DIAGNOSIS — R7989 Other specified abnormal findings of blood chemistry: Secondary | ICD-10-CM | POA: Diagnosis not present

## 2018-04-13 LAB — LIPID PANEL
CHOL/HDL RATIO: 2
Cholesterol: 202 mg/dL — ABNORMAL HIGH (ref 0–200)
HDL: 102.9 mg/dL (ref >39.00)
LDL CALC: 83 mg/dL (ref 0–99)
NONHDL: 99.49
Triglycerides: 82 mg/dL (ref 0.0–149.0)
VLDL: 16.4 mg/dL (ref 0.0–40.0)

## 2018-04-13 LAB — GLUCOSE, RANDOM: GLUCOSE: 87 mg/dL (ref 70–99)

## 2018-04-14 ENCOUNTER — Encounter: Payer: BLUE CROSS/BLUE SHIELD | Admitting: Family Medicine

## 2018-04-20 ENCOUNTER — Encounter: Payer: Self-pay | Admitting: Family Medicine

## 2018-04-20 ENCOUNTER — Ambulatory Visit (INDEPENDENT_AMBULATORY_CARE_PROVIDER_SITE_OTHER): Payer: BLUE CROSS/BLUE SHIELD | Admitting: Family Medicine

## 2018-04-20 VITALS — BP 104/72 | HR 87 | Temp 98.8°F | Ht 61.0 in | Wt 134.5 lb

## 2018-04-20 DIAGNOSIS — Z7189 Other specified counseling: Secondary | ICD-10-CM

## 2018-04-20 DIAGNOSIS — G43009 Migraine without aura, not intractable, without status migrainosus: Secondary | ICD-10-CM

## 2018-04-20 DIAGNOSIS — Z0000 Encounter for general adult medical examination without abnormal findings: Secondary | ICD-10-CM | POA: Diagnosis not present

## 2018-04-20 MED ORDER — DESOGESTREL-ETHINYL ESTRADIOL 0.15-30 MG-MCG PO TABS: 1.0000 | ORAL_TABLET | Freq: Every day | ORAL | 3 refills | Status: DC

## 2018-04-20 NOTE — Assessment & Plan Note (Signed)
Living will d/w pt.  Husband designated if patient were incapacitated.  

## 2018-04-20 NOTE — Assessment & Plan Note (Signed)
Tetanus 2013 Flu encouraged PNA and shingles not due Colonoscopy 06/25/17 with 10 year f/u.   Mammogram due, d/w pt.   Pap not due. D/w pt about guidelines.   Living will d/w pt. Husband designated if patient were incapacitated.  DXA- done prev per ortho.   Diet and exercise d/w pt. [facility], [facility] since [facility].

## 2018-04-20 NOTE — Progress Notes (Signed)
CPE- See plan.  Routine anticipatory guidance given to patient.  See health maintenance.  The possibility exists that previously documented standard health maintenance information may have been brought forward from a previous encounter into this note.  If needed, that same information has been updated to reflect the current situation based on today's encounter.    Tetanus 2013 Flu encouraged PNA and shingles not due Colonoscopy 06/25/17 with 10 year f/u.   Mammogram due, d/w pt.   Pap not due. D/w pt about guidelines.   Living will d/w pt. Husband designated if patient were incapacitated.  DXA- done prev per ortho.   Diet and exercise d/w pt. She is working on both now that her foot fx healed. [facility], [facility] since 2000.   Migraines per headache clinic on botox.  Last month she had 2 migraines, more this month.  Still on OCPs with refill needed, with OCP use to limit menses and associated migraines.  Likely reasonable continue, d/w pt.  Nonsmoker, no h/o DVT.  BP normal.    PMH and SH reviewed  Meds, vitals, and allergies reviewed.   ROS: Per HPI.  Unless specifically indicated otherwise in HPI, the patient denies:  General: fever. Eyes: acute vision changes ENT: sore throat Cardiovascular: chest pain Respiratory: SOB GI: vomiting GU: dysuria Musculoskeletal: acute back pain Derm: acute rash Neuro: acute motor dysfunction Psych: worsening mood Endocrine: polydipsia Heme: bleeding Allergy: hayfever  GEN: nad, alert and oriented HEENT: mucous membranes moist NECK: supple w/o LA CV: rrr. PULM: ctab, no inc wob ABD: soft, +bs EXT: no edema SKIN: no acute rash

## 2018-04-20 NOTE — Patient Instructions (Addendum)
You can call for a mammogram at [facility] at [facility].  [address] [place] [id number]  Don't change your meds.  Thanks for your effort.  Update me as needed.  Take care.  Glad to see you.

## 2018-04-20 NOTE — Assessment & Plan Note (Signed)
Migraines per headache clinic on botox.  Last month she had 2 migraines, more this month.  Still on OCPs with refill needed, with OCP use to limit menses and associated migraines.  Likely reasonable continue, d/w pt.  Nonsmoker, no h/o DVT.  BP normal.

## 2018-04-27 DIAGNOSIS — G43719 Chronic migraine without aura, intractable, without status migrainosus: Secondary | ICD-10-CM | POA: Diagnosis not present

## 2018-05-14 DIAGNOSIS — G43719 Chronic migraine without aura, intractable, without status migrainosus: Secondary | ICD-10-CM | POA: Diagnosis not present

## 2018-05-14 DIAGNOSIS — M791 Myalgia, unspecified site: Secondary | ICD-10-CM | POA: Diagnosis not present

## 2018-05-14 DIAGNOSIS — M542 Cervicalgia: Secondary | ICD-10-CM | POA: Diagnosis not present

## 2018-05-14 DIAGNOSIS — G518 Other disorders of facial nerve: Secondary | ICD-10-CM | POA: Diagnosis not present

## 2018-05-14 DIAGNOSIS — G43019 Migraine without aura, intractable, without status migrainosus: Secondary | ICD-10-CM | POA: Diagnosis not present

## 2018-05-19 DIAGNOSIS — G43019 Migraine without aura, intractable, without status migrainosus: Secondary | ICD-10-CM | POA: Diagnosis not present

## 2018-05-19 DIAGNOSIS — G500 Trigeminal neuralgia: Secondary | ICD-10-CM | POA: Diagnosis not present

## 2018-05-19 DIAGNOSIS — M791 Myalgia, unspecified site: Secondary | ICD-10-CM | POA: Diagnosis not present

## 2018-05-19 DIAGNOSIS — M542 Cervicalgia: Secondary | ICD-10-CM | POA: Diagnosis not present

## 2018-05-19 DIAGNOSIS — G518 Other disorders of facial nerve: Secondary | ICD-10-CM | POA: Diagnosis not present

## 2018-05-19 DIAGNOSIS — G509 Disorder of trigeminal nerve, unspecified: Secondary | ICD-10-CM | POA: Diagnosis not present

## 2018-05-19 DIAGNOSIS — G43719 Chronic migraine without aura, intractable, without status migrainosus: Secondary | ICD-10-CM | POA: Diagnosis not present

## 2018-05-25 ENCOUNTER — Other Ambulatory Visit: Payer: Self-pay | Admitting: Family Medicine

## 2018-05-25 DIAGNOSIS — Z1231 Encounter for screening mammogram for malignant neoplasm of breast: Secondary | ICD-10-CM

## 2018-05-26 ENCOUNTER — Encounter: Payer: Self-pay | Admitting: Family Medicine

## 2018-06-23 DIAGNOSIS — G518 Other disorders of facial nerve: Secondary | ICD-10-CM | POA: Diagnosis not present

## 2018-06-23 DIAGNOSIS — M791 Myalgia, unspecified site: Secondary | ICD-10-CM | POA: Diagnosis not present

## 2018-06-23 DIAGNOSIS — G43019 Migraine without aura, intractable, without status migrainosus: Secondary | ICD-10-CM | POA: Diagnosis not present

## 2018-06-23 DIAGNOSIS — M542 Cervicalgia: Secondary | ICD-10-CM | POA: Diagnosis not present

## 2018-06-23 DIAGNOSIS — G43719 Chronic migraine without aura, intractable, without status migrainosus: Secondary | ICD-10-CM | POA: Diagnosis not present

## 2018-06-25 ENCOUNTER — Ambulatory Visit
Admission: RE | Admit: 2018-06-25 | Discharge: 2018-06-25 | Disposition: A | Discharge status: Home / Self Care | Payer: BLUE CROSS/BLUE SHIELD | Source: Ambulatory Visit | Attending: Family Medicine | Admitting: Family Medicine

## 2018-06-25 DIAGNOSIS — Z1231 Encounter for screening mammogram for malignant neoplasm of breast: Secondary | ICD-10-CM | POA: Insufficient documentation

## 2018-08-05 DIAGNOSIS — G43719 Chronic migraine without aura, intractable, without status migrainosus: Secondary | ICD-10-CM | POA: Diagnosis not present

## 2018-08-13 DIAGNOSIS — M791 Myalgia, unspecified site: Secondary | ICD-10-CM | POA: Diagnosis not present

## 2018-08-13 DIAGNOSIS — G43719 Chronic migraine without aura, intractable, without status migrainosus: Secondary | ICD-10-CM | POA: Diagnosis not present

## 2018-08-13 DIAGNOSIS — G43019 Migraine without aura, intractable, without status migrainosus: Secondary | ICD-10-CM | POA: Diagnosis not present

## 2018-08-13 DIAGNOSIS — G518 Other disorders of facial nerve: Secondary | ICD-10-CM | POA: Diagnosis not present

## 2018-08-13 DIAGNOSIS — M542 Cervicalgia: Secondary | ICD-10-CM | POA: Diagnosis not present

## 2018-09-08 ENCOUNTER — Ambulatory Visit: Payer: Self-pay

## 2018-09-24 DIAGNOSIS — G43719 Chronic migraine without aura, intractable, without status migrainosus: Secondary | ICD-10-CM | POA: Diagnosis not present

## 2018-09-24 DIAGNOSIS — G518 Other disorders of facial nerve: Secondary | ICD-10-CM | POA: Diagnosis not present

## 2018-09-24 DIAGNOSIS — G43019 Migraine without aura, intractable, without status migrainosus: Secondary | ICD-10-CM | POA: Diagnosis not present

## 2018-09-24 DIAGNOSIS — M542 Cervicalgia: Secondary | ICD-10-CM | POA: Diagnosis not present

## 2018-09-24 DIAGNOSIS — M791 Myalgia, unspecified site: Secondary | ICD-10-CM | POA: Diagnosis not present

## 2018-11-03 DIAGNOSIS — G43719 Chronic migraine without aura, intractable, without status migrainosus: Secondary | ICD-10-CM | POA: Diagnosis not present

## 2018-11-11 DIAGNOSIS — M791 Myalgia, unspecified site: Secondary | ICD-10-CM | POA: Diagnosis not present

## 2018-11-11 DIAGNOSIS — G518 Other disorders of facial nerve: Secondary | ICD-10-CM | POA: Diagnosis not present

## 2018-11-11 DIAGNOSIS — G43019 Migraine without aura, intractable, without status migrainosus: Secondary | ICD-10-CM | POA: Diagnosis not present

## 2018-11-11 DIAGNOSIS — G43719 Chronic migraine without aura, intractable, without status migrainosus: Secondary | ICD-10-CM | POA: Diagnosis not present

## 2018-11-11 DIAGNOSIS — M542 Cervicalgia: Secondary | ICD-10-CM | POA: Diagnosis not present

## 2018-12-24 DIAGNOSIS — G518 Other disorders of facial nerve: Secondary | ICD-10-CM | POA: Diagnosis not present

## 2018-12-24 DIAGNOSIS — M791 Myalgia, unspecified site: Secondary | ICD-10-CM | POA: Diagnosis not present

## 2018-12-24 DIAGNOSIS — G43719 Chronic migraine without aura, intractable, without status migrainosus: Secondary | ICD-10-CM | POA: Diagnosis not present

## 2018-12-24 DIAGNOSIS — M542 Cervicalgia: Secondary | ICD-10-CM | POA: Diagnosis not present

## 2018-12-24 DIAGNOSIS — G43019 Migraine without aura, intractable, without status migrainosus: Secondary | ICD-10-CM | POA: Diagnosis not present

## 2019-02-05 ENCOUNTER — Telehealth: Payer: Self-pay

## 2019-02-05 MED ORDER — ZOLMITRIPTAN 5 MG PO TABS: 5.0000 mg | ORAL_TABLET | ORAL | 0 refills | Status: AC | PRN

## 2019-02-05 NOTE — Telephone Encounter (Signed)
Pt left v/m; pt contacted headache specialist Dr Neale Burly requesting refill for zolmitriptan for migraine on 02/04/19. Pt called pharmacy and Dr Nanine Means office closed today at 12 noon. Pt requesting Dr Para March to refill zolmitriptan until can get refill from Dr Onnie Boer office on 02/08/19. Pt request an emergency supply since pt has migraine. Pt request [facility] [address].

## 2019-02-05 NOTE — Telephone Encounter (Signed)
Sent. Thanks.   

## 2019-02-08 DIAGNOSIS — G43719 Chronic migraine without aura, intractable, without status migrainosus: Secondary | ICD-10-CM | POA: Diagnosis not present

## 2019-02-11 DIAGNOSIS — G518 Other disorders of facial nerve: Secondary | ICD-10-CM | POA: Diagnosis not present

## 2019-02-11 DIAGNOSIS — G43019 Migraine without aura, intractable, without status migrainosus: Secondary | ICD-10-CM | POA: Diagnosis not present

## 2019-02-11 DIAGNOSIS — M542 Cervicalgia: Secondary | ICD-10-CM | POA: Diagnosis not present

## 2019-02-11 DIAGNOSIS — G43719 Chronic migraine without aura, intractable, without status migrainosus: Secondary | ICD-10-CM | POA: Diagnosis not present

## 2019-02-11 DIAGNOSIS — M791 Myalgia, unspecified site: Secondary | ICD-10-CM | POA: Diagnosis not present

## 2019-02-15 ENCOUNTER — Other Ambulatory Visit: Payer: Self-pay | Admitting: Family Medicine

## 2019-02-15 NOTE — Telephone Encounter (Signed)
Electronic refill request. [facility] Last office visit:   04/20/2018 Last Filled:    3 Package 3 04/20/2018  Please advise.

## 2019-02-16 NOTE — Telephone Encounter (Signed)
Sent. Thanks.  Has CPE scheduled.   

## 2019-03-25 DIAGNOSIS — M542 Cervicalgia: Secondary | ICD-10-CM | POA: Diagnosis not present

## 2019-03-25 DIAGNOSIS — G43019 Migraine without aura, intractable, without status migrainosus: Secondary | ICD-10-CM | POA: Diagnosis not present

## 2019-03-25 DIAGNOSIS — M791 Myalgia, unspecified site: Secondary | ICD-10-CM | POA: Diagnosis not present

## 2019-03-25 DIAGNOSIS — G43719 Chronic migraine without aura, intractable, without status migrainosus: Secondary | ICD-10-CM | POA: Diagnosis not present

## 2019-03-25 DIAGNOSIS — G518 Other disorders of facial nerve: Secondary | ICD-10-CM | POA: Diagnosis not present

## 2019-04-16 ENCOUNTER — Other Ambulatory Visit: Payer: Self-pay

## 2019-04-22 ENCOUNTER — Encounter: Payer: Self-pay | Admitting: Family Medicine

## 2019-05-11 DIAGNOSIS — G43719 Chronic migraine without aura, intractable, without status migrainosus: Secondary | ICD-10-CM | POA: Diagnosis not present

## 2019-05-13 DIAGNOSIS — G43719 Chronic migraine without aura, intractable, without status migrainosus: Secondary | ICD-10-CM | POA: Diagnosis not present

## 2019-05-13 DIAGNOSIS — G43019 Migraine without aura, intractable, without status migrainosus: Secondary | ICD-10-CM | POA: Diagnosis not present

## 2019-05-13 DIAGNOSIS — M542 Cervicalgia: Secondary | ICD-10-CM | POA: Diagnosis not present

## 2019-05-13 DIAGNOSIS — M791 Myalgia, unspecified site: Secondary | ICD-10-CM | POA: Diagnosis not present

## 2019-05-18 ENCOUNTER — Other Ambulatory Visit (INDEPENDENT_AMBULATORY_CARE_PROVIDER_SITE_OTHER): Payer: BC Managed Care – PPO

## 2019-05-18 ENCOUNTER — Other Ambulatory Visit: Payer: Self-pay | Admitting: Family Medicine

## 2019-05-18 DIAGNOSIS — R7989 Other specified abnormal findings of blood chemistry: Secondary | ICD-10-CM

## 2019-05-18 LAB — LIPID PANEL
Cholesterol: 200 mg/dL (ref 0–200)
HDL: 99.7 mg/dL (ref >39.00)
LDL Cholesterol: 87 mg/dL (ref 0–99)
NonHDL: 99.84
Total CHOL/HDL Ratio: 2
Triglycerides: 63 mg/dL (ref 0.0–149.0)
VLDL: 12.6 mg/dL (ref 0.0–40.0)

## 2019-05-18 LAB — COMPREHENSIVE METABOLIC PANEL
ALT: 17 U/L (ref 0–35)
AST: 18 U/L (ref 0–37)
Albumin: 4 g/dL (ref 3.5–5.2)
Alkaline Phosphatase: 36 U/L — ABNORMAL LOW (ref 39–117)
BUN: 11 mg/dL (ref 6–23)
CO2: 27 mEq/L (ref 19–32)
Calcium: 9.2 mg/dL (ref 8.4–10.5)
Chloride: 99 mEq/L (ref 96–112)
Creatinine, Ser: 0.74 mg/dL (ref 0.40–1.20)
GFR: 82.26 mL/min (ref >60.00)
Glucose, Bld: 85 mg/dL (ref 70–99)
Potassium: 4 mEq/L (ref 3.5–5.1)
Sodium: 133 mEq/L — ABNORMAL LOW (ref 135–145)
Total Bilirubin: 0.5 mg/dL (ref 0.2–1.2)
Total Protein: 6.7 g/dL (ref 6.0–8.3)

## 2019-05-25 ENCOUNTER — Other Ambulatory Visit: Payer: Self-pay

## 2019-05-25 ENCOUNTER — Encounter: Payer: Self-pay | Admitting: Family Medicine

## 2019-05-25 ENCOUNTER — Ambulatory Visit (INDEPENDENT_AMBULATORY_CARE_PROVIDER_SITE_OTHER): Payer: BC Managed Care – PPO | Admitting: Family Medicine

## 2019-05-25 VITALS — BP 108/70 | HR 81 | Temp 98.3°F | Ht 62.0 in | Wt 145.0 lb

## 2019-05-25 DIAGNOSIS — Z7189 Other specified counseling: Secondary | ICD-10-CM

## 2019-05-25 DIAGNOSIS — Z0000 Encounter for general adult medical examination without abnormal findings: Secondary | ICD-10-CM | POA: Diagnosis not present

## 2019-05-25 DIAGNOSIS — G43009 Migraine without aura, not intractable, without status migrainosus: Secondary | ICD-10-CM

## 2019-05-25 MED ORDER — DESOGESTREL-ETHINYL ESTRADIOL 0.15-30 MG-MCG PO TABS: 1.0000 | ORAL_TABLET | Freq: Every day | ORAL | 3 refills | Status: DC

## 2019-05-25 NOTE — Patient Instructions (Signed)
Assuming the auras don't get more frequent or worse, then continue as is.  The point of your OCP use is to decrease triggering events.   Take care.  Glad to see you.  Update me as needed.

## 2019-05-25 NOTE — Progress Notes (Signed)
CPE- See plan.  Routine anticipatory guidance given to patient.  See health maintenance.  The possibility exists that previously documented standard health maintenance information may have been brought forward from a previous encounter into this note.  If needed, that same information has been updated to reflect the current situation based on today's encounter.    Tetanus 2013 Flu encouraged PNA and shingles not due  Colonoscopy 06/25/17 with 10 year f/u.   Mammogram 2019, she'll call about f/u when possible.  Pap not due. D/w pt about guidelines.   Living will d/w pt. Husband designated if patient were incapacitated.  DXA- doneprevper ortho.   Diet and exercise d/w pt.  She has been using a home exercise bike and strength training/yoga.  Diet is good.   [facility], [facility] since [facility].  Labs d/w pt.   Pandemic considerations d/w pt.   Migraines per Dr. Domingo Cocking.  botox every 3 months.  She has variable relief from botox, with sometimes she has sig benefit.  I am still sending her OCPs with extra doses per migraine clinic.  She only has 2 periods per year, last in 01/2019.  Dec in frequency of menses had a benefit for migraines.  She also is triggered by weather changes.   She is trying acupuncture for her migraines.   She rarely has a aura, d/w pt.  She is on OCPs to decrease her migraine frequency, d/w pt.   She had some weight gain and decrease in libido since starting TCA.  She can consider asking Dr. Domingo Cocking about possibly lowering her TCA dose.    PMH and SH reviewed  Meds, vitals, and allergies reviewed.   ROS: Per HPI.  Unless specifically indicated otherwise in HPI, the patient denies:  General: fever. Eyes: acute vision changes ENT: sore throat Cardiovascular: chest pain Respiratory: SOB GI: vomiting GU: dysuria Musculoskeletal: acute back pain Derm: acute rash Neuro: acute motor dysfunction Psych: worsening mood Endocrine:  polydipsia Heme: bleeding Allergy: hayfever  GEN: nad, alert and oriented HEENT: ncat NECK: supple w/o LA CV: rrr. PULM: ctab, no inc wob ABD: soft, +bs EXT: no edema SKIN: no acute rash

## 2019-05-26 NOTE — Assessment & Plan Note (Signed)
Tetanus 2013 Flu encouraged PNA and shingles not due  Colonoscopy 06/25/17 with 10 year f/u.   Mammogram 2019, she'll call about f/u when possible.  Pap not due. D/w pt about guidelines.   Living will d/w pt. Husband designated if patient were incapacitated.  DXA- doneprevper ortho.   Diet and exercise d/w pt.  She has been using a home exercise bike and strength training/yoga.  Diet is good.   [facility] since 2000.

## 2019-05-26 NOTE — Assessment & Plan Note (Signed)
Living will d/w pt.  Husband designated if patient were incapacitated.  

## 2019-05-26 NOTE — Assessment & Plan Note (Signed)
She is followed by the headache clinic.  The headache clinic has endorsed her use of birth control pills to try to limit her periods.  She rarely has migraine with aura.  The use of oral contraceptive pills in her case has decreased her periods which has been of significant benefit to her in terms of migraine prevention.  It makes sense to consider continue OCPs in this case, given the severity of her baseline migraines, the benefit with OCPs, etc.  Discussed.  She had some weight gain and decrease in libido since starting TCA.  She can consider asking Dr. Domingo Cocking about possibly lowering her TCA dose.

## 2019-06-08 ENCOUNTER — Other Ambulatory Visit: Payer: Self-pay | Admitting: Family Medicine

## 2019-06-08 DIAGNOSIS — Z1231 Encounter for screening mammogram for malignant neoplasm of breast: Secondary | ICD-10-CM

## 2019-06-23 DIAGNOSIS — G43719 Chronic migraine without aura, intractable, without status migrainosus: Secondary | ICD-10-CM | POA: Diagnosis not present

## 2019-06-23 DIAGNOSIS — M542 Cervicalgia: Secondary | ICD-10-CM | POA: Diagnosis not present

## 2019-06-23 DIAGNOSIS — G43019 Migraine without aura, intractable, without status migrainosus: Secondary | ICD-10-CM | POA: Diagnosis not present

## 2019-07-16 ENCOUNTER — Ambulatory Visit
Admission: RE | Admit: 2019-07-16 | Discharge: 2019-07-16 | Disposition: A | Discharge status: Home / Self Care | Payer: BC Managed Care – PPO | Source: Ambulatory Visit | Attending: Family Medicine | Admitting: Family Medicine

## 2019-07-16 DIAGNOSIS — Z1231 Encounter for screening mammogram for malignant neoplasm of breast: Secondary | ICD-10-CM | POA: Diagnosis not present

## 2019-07-28 DIAGNOSIS — G43719 Chronic migraine without aura, intractable, without status migrainosus: Secondary | ICD-10-CM | POA: Diagnosis not present

## 2019-08-06 DIAGNOSIS — H3582 Retinal ischemia: Secondary | ICD-10-CM | POA: Diagnosis not present

## 2019-08-10 DIAGNOSIS — H43813 Vitreous degeneration, bilateral: Secondary | ICD-10-CM | POA: Diagnosis not present

## 2019-08-10 DIAGNOSIS — H3582 Retinal ischemia: Secondary | ICD-10-CM | POA: Diagnosis not present

## 2019-08-10 DIAGNOSIS — G43109 Migraine with aura, not intractable, without status migrainosus: Secondary | ICD-10-CM | POA: Diagnosis not present

## 2019-08-10 DIAGNOSIS — H269 Unspecified cataract: Secondary | ICD-10-CM | POA: Diagnosis not present

## 2019-08-12 DIAGNOSIS — G43719 Chronic migraine without aura, intractable, without status migrainosus: Secondary | ICD-10-CM | POA: Diagnosis not present

## 2019-08-12 DIAGNOSIS — G43019 Migraine without aura, intractable, without status migrainosus: Secondary | ICD-10-CM | POA: Diagnosis not present

## 2019-08-12 DIAGNOSIS — M542 Cervicalgia: Secondary | ICD-10-CM | POA: Diagnosis not present

## 2019-09-07 DIAGNOSIS — H43393 Other vitreous opacities, bilateral: Secondary | ICD-10-CM | POA: Diagnosis not present

## 2019-09-07 DIAGNOSIS — H3581 Retinal edema: Secondary | ICD-10-CM | POA: Diagnosis not present

## 2019-09-07 DIAGNOSIS — H269 Unspecified cataract: Secondary | ICD-10-CM | POA: Diagnosis not present

## 2019-09-14 DIAGNOSIS — M542 Cervicalgia: Secondary | ICD-10-CM | POA: Diagnosis not present

## 2019-09-14 DIAGNOSIS — G43719 Chronic migraine without aura, intractable, without status migrainosus: Secondary | ICD-10-CM | POA: Diagnosis not present

## 2019-09-14 DIAGNOSIS — G43019 Migraine without aura, intractable, without status migrainosus: Secondary | ICD-10-CM | POA: Diagnosis not present

## 2019-09-22 ENCOUNTER — Other Ambulatory Visit: Payer: Self-pay

## 2019-09-22 ENCOUNTER — Ambulatory Visit: Payer: Self-pay

## 2019-09-22 DIAGNOSIS — Z23 Encounter for immunization: Secondary | ICD-10-CM

## 2019-11-03 DIAGNOSIS — G43719 Chronic migraine without aura, intractable, without status migrainosus: Secondary | ICD-10-CM | POA: Diagnosis not present

## 2019-11-10 DIAGNOSIS — M542 Cervicalgia: Secondary | ICD-10-CM | POA: Diagnosis not present

## 2019-11-10 DIAGNOSIS — G43019 Migraine without aura, intractable, without status migrainosus: Secondary | ICD-10-CM | POA: Diagnosis not present

## 2019-11-10 DIAGNOSIS — G43719 Chronic migraine without aura, intractable, without status migrainosus: Secondary | ICD-10-CM | POA: Diagnosis not present

## 2019-12-13 DIAGNOSIS — G43719 Chronic migraine without aura, intractable, without status migrainosus: Secondary | ICD-10-CM | POA: Diagnosis not present

## 2019-12-13 DIAGNOSIS — M542 Cervicalgia: Secondary | ICD-10-CM | POA: Diagnosis not present

## 2019-12-13 DIAGNOSIS — G43019 Migraine without aura, intractable, without status migrainosus: Secondary | ICD-10-CM | POA: Diagnosis not present

## 2020-01-11 DIAGNOSIS — M542 Cervicalgia: Secondary | ICD-10-CM | POA: Diagnosis not present

## 2020-01-11 DIAGNOSIS — G43019 Migraine without aura, intractable, without status migrainosus: Secondary | ICD-10-CM | POA: Diagnosis not present

## 2020-01-11 DIAGNOSIS — G43719 Chronic migraine without aura, intractable, without status migrainosus: Secondary | ICD-10-CM | POA: Diagnosis not present

## 2020-02-01 DIAGNOSIS — G43019 Migraine without aura, intractable, without status migrainosus: Secondary | ICD-10-CM | POA: Diagnosis not present

## 2020-02-01 DIAGNOSIS — G43719 Chronic migraine without aura, intractable, without status migrainosus: Secondary | ICD-10-CM | POA: Diagnosis not present

## 2020-02-08 DIAGNOSIS — M542 Cervicalgia: Secondary | ICD-10-CM | POA: Diagnosis not present

## 2020-02-08 DIAGNOSIS — G43719 Chronic migraine without aura, intractable, without status migrainosus: Secondary | ICD-10-CM | POA: Diagnosis not present

## 2020-02-08 DIAGNOSIS — G43019 Migraine without aura, intractable, without status migrainosus: Secondary | ICD-10-CM | POA: Diagnosis not present

## 2020-03-08 DIAGNOSIS — M542 Cervicalgia: Secondary | ICD-10-CM | POA: Diagnosis not present

## 2020-03-08 DIAGNOSIS — G43719 Chronic migraine without aura, intractable, without status migrainosus: Secondary | ICD-10-CM | POA: Diagnosis not present

## 2020-03-08 DIAGNOSIS — G43019 Migraine without aura, intractable, without status migrainosus: Secondary | ICD-10-CM | POA: Diagnosis not present

## 2020-04-04 DIAGNOSIS — G43719 Chronic migraine without aura, intractable, without status migrainosus: Secondary | ICD-10-CM | POA: Diagnosis not present

## 2020-04-04 DIAGNOSIS — M542 Cervicalgia: Secondary | ICD-10-CM | POA: Diagnosis not present

## 2020-04-04 DIAGNOSIS — G43019 Migraine without aura, intractable, without status migrainosus: Secondary | ICD-10-CM | POA: Diagnosis not present

## 2020-05-02 DIAGNOSIS — G43019 Migraine without aura, intractable, without status migrainosus: Secondary | ICD-10-CM | POA: Diagnosis not present

## 2020-05-02 DIAGNOSIS — G43719 Chronic migraine without aura, intractable, without status migrainosus: Secondary | ICD-10-CM | POA: Diagnosis not present

## 2020-05-02 DIAGNOSIS — M542 Cervicalgia: Secondary | ICD-10-CM | POA: Diagnosis not present

## 2020-05-04 ENCOUNTER — Other Ambulatory Visit: Payer: Self-pay | Admitting: Family Medicine

## 2020-05-04 DIAGNOSIS — R7989 Other specified abnormal findings of blood chemistry: Secondary | ICD-10-CM

## 2020-05-18 ENCOUNTER — Other Ambulatory Visit (INDEPENDENT_AMBULATORY_CARE_PROVIDER_SITE_OTHER): Payer: BC Managed Care – PPO

## 2020-05-18 ENCOUNTER — Other Ambulatory Visit: Payer: Self-pay

## 2020-05-18 DIAGNOSIS — R7989 Other specified abnormal findings of blood chemistry: Secondary | ICD-10-CM

## 2020-05-18 LAB — LIPID PANEL
Cholesterol: 215 mg/dL — ABNORMAL HIGH (ref 0–200)
HDL: 89.7 mg/dL (ref >39.00)
LDL Cholesterol: 106 mg/dL — ABNORMAL HIGH (ref 0–99)
NonHDL: 125.14
Total CHOL/HDL Ratio: 2
Triglycerides: 94 mg/dL (ref 0.0–149.0)
VLDL: 18.8 mg/dL (ref 0.0–40.0)

## 2020-05-18 LAB — COMPREHENSIVE METABOLIC PANEL
ALT: 17 U/L (ref 0–35)
AST: 19 U/L (ref 0–37)
Albumin: 4.3 g/dL (ref 3.5–5.2)
Alkaline Phosphatase: 46 U/L (ref 39–117)
BUN: 14 mg/dL (ref 6–23)
CO2: 27 mEq/L (ref 19–32)
Calcium: 9.5 mg/dL (ref 8.4–10.5)
Chloride: 96 mEq/L (ref 96–112)
Creatinine, Ser: 0.78 mg/dL (ref 0.40–1.20)
GFR: 77.11 mL/min (ref >60.00)
Glucose, Bld: 87 mg/dL (ref 70–99)
Potassium: 4 mEq/L (ref 3.5–5.1)
Sodium: 129 mEq/L — ABNORMAL LOW (ref 135–145)
Total Bilirubin: 0.6 mg/dL (ref 0.2–1.2)
Total Protein: 7 g/dL (ref 6.0–8.3)

## 2020-05-25 ENCOUNTER — Ambulatory Visit (INDEPENDENT_AMBULATORY_CARE_PROVIDER_SITE_OTHER): Payer: BC Managed Care – PPO | Admitting: Family Medicine

## 2020-05-25 ENCOUNTER — Other Ambulatory Visit: Payer: Self-pay

## 2020-05-25 ENCOUNTER — Encounter: Payer: Self-pay | Admitting: Family Medicine

## 2020-05-25 ENCOUNTER — Ambulatory Visit (INDEPENDENT_AMBULATORY_CARE_PROVIDER_SITE_OTHER)
Admission: RE | Admit: 2020-05-25 | Discharge: 2020-05-25 | Disposition: A | Discharge status: Home / Self Care | Payer: BC Managed Care – PPO | Source: Ambulatory Visit | Attending: Family Medicine | Admitting: Family Medicine

## 2020-05-25 VITALS — BP 110/70 | HR 85 | Temp 96.6°F | Ht 62.0 in | Wt 149.0 lb

## 2020-05-25 DIAGNOSIS — G8929 Other chronic pain: Secondary | ICD-10-CM | POA: Diagnosis not present

## 2020-05-25 DIAGNOSIS — G43009 Migraine without aura, not intractable, without status migrainosus: Secondary | ICD-10-CM

## 2020-05-25 DIAGNOSIS — M25512 Pain in left shoulder: Secondary | ICD-10-CM | POA: Diagnosis not present

## 2020-05-25 DIAGNOSIS — Z0000 Encounter for general adult medical examination without abnormal findings: Secondary | ICD-10-CM

## 2020-05-25 DIAGNOSIS — Z7189 Other specified counseling: Secondary | ICD-10-CM

## 2020-05-25 MED ORDER — DESOGESTREL-ETHINYL ESTRADIOL 0.15-30 MG-MCG PO TABS: 1.0000 | ORAL_TABLET | Freq: Every day | ORAL | 3 refills | Status: DC

## 2020-05-25 NOTE — Patient Instructions (Addendum)
Go to the lab on the way out.   If you have mychart we'll likely use that to update you.    Let me see about options re: your lower sodium level.  Try kegel exercises and see if that helps with urination.   Take care.  Glad to see you.

## 2020-05-25 NOTE — Progress Notes (Signed)
This visit occurred during the SARS-CoV-2 public health emergency.  Safety protocols were in place, including screening questions prior to the visit, additional usage of staff PPE, and extensive cleaning of exam room while observing appropriate contact time as indicated for disinfecting solutions.  CPE- See plan.  Routine anticipatory guidance given to patient.  See health maintenance.  The possibility exists that previously documented standard health maintenance information may have been brought forward from a previous encounter into this note.  If needed, that same information has been updated to reflect the current situation based on today's encounter.   Tetanus 2013 Flu 2020 covid 2021 PNA and shingles not due. Colonoscopy 06/25/17 with 10 year f/u.   Mammogram 2020 Living will d/w pt. Husband designated if patient were incapacitated.  DXA- doneprevper ortho.   Diet and exercise d/w pt.  She has been using a home exercise bike and strength training.  Diet is good.   [facility] HCV [facility], likely [facility] since [facility]. Pap not yet due.    Mildly low sodium noted.  I want to consider this.  See follow-up notes.  Nocturia increased recently.  No burning with urination.  No abd pain.  Discussed with patient about trying Kegel exercises.  See after visit summary.  L shoulder aches.  Consistent.  Some days are better than others.  No sharp pain.  10 lbs max with overhead weights.  Sleeps on her stomach with tingling in the arm at night.    Migraines per neuro, she is doing well and "better than with any other meds."  Still with mult HA per month but less than prev.  She is getting acupuncture treatment for headaches and that helped.  Periods are a trigger for patient.  She only has 2 periods per year with OCPs.  Reasonable to try to have a period twice a year, d/w pt.    PMH and SH reviewed  Meds, vitals, and allergies reviewed.   ROS: Per HPI.  Unless specifically  indicated otherwise in HPI, the patient denies:  General: fever. Eyes: acute vision changes ENT: sore throat Cardiovascular: chest pain Respiratory: SOB GI: vomiting GU: dysuria Musculoskeletal: acute back pain Derm: acute rash Neuro: acute motor dysfunction Psych: worsening mood Endocrine: polydipsia Heme: bleeding Allergy: hayfever  GEN: nad, alert and oriented HEENT: ncat NECK: supple w/o LA CV: rrr. PULM: ctab, no inc wob ABD: soft, +bs EXT: no edema SKIN: no acute rash Normal L shoulder ROM, int and ext rotation.  No arm drop.  Distal neurovascular intact.  AC joint nontender.   The 10-year ASCVD risk score Denman George DC Montez Hageman., et al., 2013) is: 0.8%   Values used to calculate the score:     Age: 54 years     Sex: Female     Is Non-Hispanic African American: No     Diabetic: No     Tobacco smoker: No     Systolic Blood Pressure: 110 mmHg     Is BP treated: No     HDL Cholesterol: 89.7 mg/dL     Total Cholesterol: 215 mg/dL

## 2020-05-28 NOTE — Assessment & Plan Note (Signed)
Tetanus 2013 Flu 2020 covid 2021 PNA and shingles not due. Colonoscopy 06/25/17 with 10 year f/u.   Mammogram 2020 Living will d/w pt. Husband designated if patient were incapacitated.  DXA- doneprevper ortho.   Diet and exercise d/w pt.  She has been using a home exercise bike and strength training.  Diet is good.   [facility] since [facility]. Pap not yet due.

## 2020-05-28 NOTE — Assessment & Plan Note (Signed)
Unclear source.  Reasonable to check plain films.  See notes on imaging.

## 2020-05-28 NOTE — Assessment & Plan Note (Signed)
Living will d/w pt.  Husband designated if patient were incapacitated.  

## 2020-05-28 NOTE — Assessment & Plan Note (Signed)
Migraines per neuro, she is doing well and "better than with any other meds."  Still with mult HA per month but less than prev.  She is getting acupuncture treatment for headaches and that helped.  Periods are a trigger for patient.  She only has 2 periods per year with OCPs.  Reasonable to try to have a period twice a year, d/w pt.    reasonable to continue as is.

## 2020-05-30 DIAGNOSIS — G43719 Chronic migraine without aura, intractable, without status migrainosus: Secondary | ICD-10-CM | POA: Diagnosis not present

## 2020-05-30 DIAGNOSIS — M542 Cervicalgia: Secondary | ICD-10-CM | POA: Diagnosis not present

## 2020-05-30 DIAGNOSIS — G43019 Migraine without aura, intractable, without status migrainosus: Secondary | ICD-10-CM | POA: Diagnosis not present

## 2020-06-27 DIAGNOSIS — G43019 Migraine without aura, intractable, without status migrainosus: Secondary | ICD-10-CM | POA: Diagnosis not present

## 2020-06-27 DIAGNOSIS — M542 Cervicalgia: Secondary | ICD-10-CM | POA: Diagnosis not present

## 2020-06-27 DIAGNOSIS — G43719 Chronic migraine without aura, intractable, without status migrainosus: Secondary | ICD-10-CM | POA: Diagnosis not present

## 2020-07-14 ENCOUNTER — Encounter: Payer: Self-pay | Admitting: Family Medicine

## 2020-07-14 ENCOUNTER — Ambulatory Visit (INDEPENDENT_AMBULATORY_CARE_PROVIDER_SITE_OTHER)
Admission: RE | Admit: 2020-07-14 | Discharge: 2020-07-14 | Disposition: A | Discharge status: Home / Self Care | Payer: BC Managed Care – PPO | Source: Ambulatory Visit | Attending: Family Medicine | Admitting: Family Medicine

## 2020-07-14 ENCOUNTER — Ambulatory Visit: Payer: BC Managed Care – PPO | Admitting: Family Medicine

## 2020-07-14 ENCOUNTER — Other Ambulatory Visit: Payer: Self-pay

## 2020-07-14 VITALS — BP 118/62 | HR 82 | Temp 97.1°F | Ht 62.0 in | Wt 147.4 lb

## 2020-07-14 DIAGNOSIS — M79672 Pain in left foot: Secondary | ICD-10-CM

## 2020-07-14 DIAGNOSIS — M7732 Calcaneal spur, left foot: Secondary | ICD-10-CM | POA: Diagnosis not present

## 2020-07-14 NOTE — Patient Instructions (Signed)
Xray today.  Assuming no fracture, then ice for 10 minutes at a time if tolerated.  Ibuprofen as needed with food.   Use the lace up ankle brace when weight bearing.   Take care.  Glad to see you.

## 2020-07-14 NOTE — Progress Notes (Signed)
This visit occurred during the SARS-CoV-2 public health emergency.  Safety protocols were in place, including screening questions prior to the visit, additional usage of staff PPE, and extensive cleaning of exam room while observing appropriate contact time as indicated for disinfecting solutions.  L foot pain.  Dorsal 4th MT area.  Noted about 10 days ago.  No trauma.  No new shoes or trigger.  No redness, no bruising.  Wasn't severe pain but wasn't getting better.  Then more pain yesterday when getting up from sitting.  More pain on standing after prolonged sitting.  Feel better after more walking.  No plantar pain.    Meds, vitals, and allergies reviewed.   ROS: Per HPI unless specifically indicated in ROS section   nad No pain initially sitting in chair at exam room.   Left foot without bruising or rash.  Intact dorsalis pedis pulse.  Not tender to palpation on the medial or lateral malleolar.  Achilles and calcaneus not tender.  Distally neurovascularly intact.  She has some discomfort near the proximal fourth metatarsal dorsally.  No local redness.  Not tender to palpation distally.

## 2020-07-16 DIAGNOSIS — M79672 Pain in left foot: Secondary | ICD-10-CM | POA: Insufficient documentation

## 2020-07-16 NOTE — Assessment & Plan Note (Signed)
See notes on imaging.  Initial read discussed with patient, no fracture.  Reasonable to use a lace up ankle brace and ice as needed for likely soft tissue injury/tendinitis that is becoming recurrently irritated.  Update me if not better.  She agrees to plan.

## 2020-07-27 DIAGNOSIS — M542 Cervicalgia: Secondary | ICD-10-CM | POA: Diagnosis not present

## 2020-07-27 DIAGNOSIS — G43719 Chronic migraine without aura, intractable, without status migrainosus: Secondary | ICD-10-CM | POA: Diagnosis not present

## 2020-07-27 DIAGNOSIS — G43019 Migraine without aura, intractable, without status migrainosus: Secondary | ICD-10-CM | POA: Diagnosis not present

## 2020-08-16 ENCOUNTER — Other Ambulatory Visit: Payer: Self-pay | Admitting: Family Medicine

## 2020-08-16 DIAGNOSIS — Z1231 Encounter for screening mammogram for malignant neoplasm of breast: Secondary | ICD-10-CM

## 2020-08-16 DIAGNOSIS — M542 Cervicalgia: Secondary | ICD-10-CM | POA: Diagnosis not present

## 2020-08-16 DIAGNOSIS — G43719 Chronic migraine without aura, intractable, without status migrainosus: Secondary | ICD-10-CM | POA: Diagnosis not present

## 2020-08-16 DIAGNOSIS — G43019 Migraine without aura, intractable, without status migrainosus: Secondary | ICD-10-CM | POA: Diagnosis not present

## 2020-09-07 ENCOUNTER — Other Ambulatory Visit: Payer: Self-pay

## 2020-09-07 ENCOUNTER — Ambulatory Visit
Admission: RE | Admit: 2020-09-07 | Discharge: 2020-09-07 | Disposition: A | Discharge status: Home / Self Care | Payer: BC Managed Care – PPO | Source: Ambulatory Visit | Attending: Family Medicine | Admitting: Family Medicine

## 2020-09-07 DIAGNOSIS — Z1231 Encounter for screening mammogram for malignant neoplasm of breast: Secondary | ICD-10-CM | POA: Diagnosis not present

## 2020-09-12 DIAGNOSIS — G43019 Migraine without aura, intractable, without status migrainosus: Secondary | ICD-10-CM | POA: Diagnosis not present

## 2020-09-12 DIAGNOSIS — M542 Cervicalgia: Secondary | ICD-10-CM | POA: Diagnosis not present

## 2020-09-12 DIAGNOSIS — G43719 Chronic migraine without aura, intractable, without status migrainosus: Secondary | ICD-10-CM | POA: Diagnosis not present

## 2020-09-26 ENCOUNTER — Other Ambulatory Visit: Payer: Self-pay

## 2020-09-26 ENCOUNTER — Ambulatory Visit: Payer: Self-pay

## 2020-09-26 ENCOUNTER — Telehealth: Payer: Self-pay | Admitting: Nurse Practitioner

## 2020-09-26 DIAGNOSIS — R0982 Postnasal drip: Secondary | ICD-10-CM

## 2020-09-26 LAB — POC COVID19 BINAXNOW: SARS Coronavirus 2 Ag: NEGATIVE

## 2020-09-26 NOTE — Progress Notes (Signed)
   Subjective:    Patient ID: Margaret Brewer, female    DOB: [birth year], 54 y.o.   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 The wedding was outdoors, unmasked. Unknown sick contacts. On her arrival back home yesterday she started to experience a sore throat and some post nasal drainage. Does not usually have fall allergies, does feel worn down from her trip.   She has been fully vaccinated for COVID-19    Review of Systems  Constitutional: Negative.   HENT: Positive for postnasal drip and sore throat.   Respiratory: Negative.   Cardiovascular: Negative.    Past Medical History:  Diagnosis Date  . Hyperlipidemia    with high HDL  . Metatarsal fracture   . Migraine without aura    varies with hormonal changes and weather changes      Objective:   Physical Exam No acute distress SpO2 98% Pulse 82  Minimal exam performed, tested at the door of clinic and phone call after visit.   Recent Results (from the past 2160 hour(s))  POC COVID-19     Status: Normal   Collection Time: 09/26/20  8:54 AM  Result Value Ref Range   SARS Coronavirus 2 Ag Negative Negative       Assessment & Plan:  Treat symptoms with OTC medications for sinus congestion and drainage (dayquil) or any comparable OTC decongestant.   RTC if symptoms persist or with new concerns.   Rapid COVID negative.

## 2020-10-09 DIAGNOSIS — G43019 Migraine without aura, intractable, without status migrainosus: Secondary | ICD-10-CM | POA: Diagnosis not present

## 2020-10-09 DIAGNOSIS — G43719 Chronic migraine without aura, intractable, without status migrainosus: Secondary | ICD-10-CM | POA: Diagnosis not present

## 2020-10-09 DIAGNOSIS — M542 Cervicalgia: Secondary | ICD-10-CM | POA: Diagnosis not present

## 2020-11-06 DIAGNOSIS — G43719 Chronic migraine without aura, intractable, without status migrainosus: Secondary | ICD-10-CM | POA: Diagnosis not present

## 2020-11-06 DIAGNOSIS — M542 Cervicalgia: Secondary | ICD-10-CM | POA: Diagnosis not present

## 2020-11-06 DIAGNOSIS — G43019 Migraine without aura, intractable, without status migrainosus: Secondary | ICD-10-CM | POA: Diagnosis not present

## 2020-12-05 DIAGNOSIS — G43719 Chronic migraine without aura, intractable, without status migrainosus: Secondary | ICD-10-CM | POA: Diagnosis not present

## 2020-12-05 DIAGNOSIS — M542 Cervicalgia: Secondary | ICD-10-CM | POA: Diagnosis not present

## 2020-12-05 DIAGNOSIS — G43019 Migraine without aura, intractable, without status migrainosus: Secondary | ICD-10-CM | POA: Diagnosis not present

## 2021-01-03 DIAGNOSIS — G43019 Migraine without aura, intractable, without status migrainosus: Secondary | ICD-10-CM | POA: Diagnosis not present

## 2021-01-03 DIAGNOSIS — M542 Cervicalgia: Secondary | ICD-10-CM | POA: Diagnosis not present

## 2021-01-03 DIAGNOSIS — G43719 Chronic migraine without aura, intractable, without status migrainosus: Secondary | ICD-10-CM | POA: Diagnosis not present

## 2021-01-23 DIAGNOSIS — M542 Cervicalgia: Secondary | ICD-10-CM | POA: Diagnosis not present

## 2021-01-23 DIAGNOSIS — G43719 Chronic migraine without aura, intractable, without status migrainosus: Secondary | ICD-10-CM | POA: Diagnosis not present

## 2021-01-23 DIAGNOSIS — G43019 Migraine without aura, intractable, without status migrainosus: Secondary | ICD-10-CM | POA: Diagnosis not present

## 2021-02-26 DIAGNOSIS — G43019 Migraine without aura, intractable, without status migrainosus: Secondary | ICD-10-CM | POA: Diagnosis not present

## 2021-02-26 DIAGNOSIS — M542 Cervicalgia: Secondary | ICD-10-CM | POA: Diagnosis not present

## 2021-02-26 DIAGNOSIS — G43719 Chronic migraine without aura, intractable, without status migrainosus: Secondary | ICD-10-CM | POA: Diagnosis not present

## 2021-03-27 DIAGNOSIS — G43719 Chronic migraine without aura, intractable, without status migrainosus: Secondary | ICD-10-CM | POA: Diagnosis not present

## 2021-03-27 DIAGNOSIS — G43019 Migraine without aura, intractable, without status migrainosus: Secondary | ICD-10-CM | POA: Diagnosis not present

## 2021-03-27 DIAGNOSIS — M542 Cervicalgia: Secondary | ICD-10-CM | POA: Diagnosis not present

## 2021-04-24 DIAGNOSIS — G43719 Chronic migraine without aura, intractable, without status migrainosus: Secondary | ICD-10-CM | POA: Diagnosis not present

## 2021-04-24 DIAGNOSIS — G43019 Migraine without aura, intractable, without status migrainosus: Secondary | ICD-10-CM | POA: Diagnosis not present

## 2021-04-24 DIAGNOSIS — M542 Cervicalgia: Secondary | ICD-10-CM | POA: Diagnosis not present

## 2021-05-14 ENCOUNTER — Other Ambulatory Visit: Payer: Self-pay | Admitting: Family Medicine

## 2021-05-22 DIAGNOSIS — M542 Cervicalgia: Secondary | ICD-10-CM | POA: Diagnosis not present

## 2021-05-22 DIAGNOSIS — G43019 Migraine without aura, intractable, without status migrainosus: Secondary | ICD-10-CM | POA: Diagnosis not present

## 2021-05-22 DIAGNOSIS — G43719 Chronic migraine without aura, intractable, without status migrainosus: Secondary | ICD-10-CM | POA: Diagnosis not present

## 2021-05-24 ENCOUNTER — Other Ambulatory Visit: Payer: Self-pay

## 2021-05-24 ENCOUNTER — Other Ambulatory Visit: Payer: Self-pay | Admitting: Family Medicine

## 2021-05-24 ENCOUNTER — Other Ambulatory Visit (INDEPENDENT_AMBULATORY_CARE_PROVIDER_SITE_OTHER): Payer: BC Managed Care – PPO

## 2021-05-24 DIAGNOSIS — R7989 Other specified abnormal findings of blood chemistry: Secondary | ICD-10-CM | POA: Diagnosis not present

## 2021-05-24 LAB — LIPID PANEL
Cholesterol: 214 mg/dL — ABNORMAL HIGH (ref 0–200)
HDL: 97.1 mg/dL (ref >39.00)
LDL Cholesterol: 96 mg/dL (ref 0–99)
NonHDL: 117.36
Total CHOL/HDL Ratio: 2
Triglycerides: 105 mg/dL (ref 0.0–149.0)
VLDL: 21 mg/dL (ref 0.0–40.0)

## 2021-05-24 LAB — COMPREHENSIVE METABOLIC PANEL
ALT: 14 U/L (ref 0–35)
AST: 16 U/L (ref 0–37)
Albumin: 4.4 g/dL (ref 3.5–5.2)
Alkaline Phosphatase: 36 U/L — ABNORMAL LOW (ref 39–117)
BUN: 12 mg/dL (ref 6–23)
CO2: 27 mEq/L (ref 19–32)
Calcium: 9.8 mg/dL (ref 8.4–10.5)
Chloride: 99 mEq/L (ref 96–112)
Creatinine, Ser: 0.83 mg/dL (ref 0.40–1.20)
GFR: 79.88 mL/min (ref >60.00)
Glucose, Bld: 85 mg/dL (ref 70–99)
Potassium: 4.5 mEq/L (ref 3.5–5.1)
Sodium: 134 mEq/L — ABNORMAL LOW (ref 135–145)
Total Bilirubin: 0.7 mg/dL (ref 0.2–1.2)
Total Protein: 7.2 g/dL (ref 6.0–8.3)

## 2021-05-29 ENCOUNTER — Other Ambulatory Visit (HOSPITAL_COMMUNITY)
Admission: RE | Admit: 2021-05-29 | Discharge: 2021-05-29 | Disposition: A | Discharge status: Home / Self Care | Payer: BC Managed Care – PPO | Source: Ambulatory Visit | Attending: Family Medicine | Admitting: Family Medicine

## 2021-05-29 ENCOUNTER — Ambulatory Visit (INDEPENDENT_AMBULATORY_CARE_PROVIDER_SITE_OTHER): Payer: BC Managed Care – PPO | Admitting: Family Medicine

## 2021-05-29 ENCOUNTER — Other Ambulatory Visit: Payer: Self-pay

## 2021-05-29 ENCOUNTER — Encounter: Payer: Self-pay | Admitting: Family Medicine

## 2021-05-29 VITALS — BP 110/72 | HR 83 | Temp 98.2°F | Ht 62.0 in | Wt 152.0 lb

## 2021-05-29 DIAGNOSIS — Z124 Encounter for screening for malignant neoplasm of cervix: Secondary | ICD-10-CM | POA: Diagnosis not present

## 2021-05-29 DIAGNOSIS — Z0000 Encounter for general adult medical examination without abnormal findings: Secondary | ICD-10-CM

## 2021-05-29 DIAGNOSIS — G43009 Migraine without aura, not intractable, without status migrainosus: Secondary | ICD-10-CM

## 2021-05-29 DIAGNOSIS — Z7189 Other specified counseling: Secondary | ICD-10-CM

## 2021-05-29 MED ORDER — AIMOVIG 70 MG/ML ~~LOC~~ SOAJ: 140.0000 mg | SUBCUTANEOUS | Status: DC

## 2021-05-29 NOTE — Progress Notes (Signed)
This visit occurred during the SARS-CoV-2 public health emergency.  Safety protocols were in place, including screening questions prior to the visit, additional usage of staff PPE, and extensive cleaning of exam room while observing appropriate contact time as indicated for disinfecting solutions.  CPE- See plan.  Routine anticipatory guidance given to patient.  See health maintenance.  The possibility exists that previously documented standard health maintenance information may have been brought forward from a previous encounter into this note.  If needed, that same information has been updated to reflect the current situation based on today's encounter.    Tetanus 2013 Flu 2021 covid 2021 PNA not due. Shingles d/w pt.   Colonoscopy 06/25/17 with 10 year f/u.   Mammogram 2021 Living will d/w pt.  Husband designated if patient were incapacitated.  DXA- done prev per ortho.   Diet and exercise d/w pt.  She has been using a home exercise bike and strength training.  Diet is good.   [facility] since [facility].   Pap 2022  Discussed with patient about options for postcoital UTI prophylaxis.  She had used Cipro in the past.  Fluoroquinolone cautions discussed with patient.  I wanted to see about options.  See following phone note.  Migraines per Dr. [facility].  She is on 140mg  aimovig.  That helped a lot.  D/w pt. She still typically has more HA prior to the week that she would usually have menses.  She has been on birth control pills to decrease menstrual frequency and help with migraine control.  She had a small amount of vaginal bleeding and cramping recently but this was self-limited and resolved in the meantime.  Labs discussed with patient, unremarkable.  PMH and SH reviewed  Meds, vitals, and allergies reviewed.   ROS: Per HPI.  Unless specifically indicated otherwise in HPI, the patient denies:  General: fever. Eyes: acute vision  changes ENT: sore throat Cardiovascular: chest pain Respiratory: SOB GI: vomiting GU: dysuria Musculoskeletal: acute back pain Derm: acute rash Neuro: acute motor dysfunction Psych: worsening mood Endocrine: polydipsia Heme: bleeding Allergy: hayfever  GEN: nad, alert and oriented HEENT: ncat NECK: supple w/o LA CV: rrr. PULM: ctab, no inc wob ABD: soft, +bs EXT: no edema SKIN: no acute rash  Normal introitus for age, no external lesions, no vaginal discharge, mucosa pink and moist, no vaginal or cervical lesions, no vaginal atrophy, normal uterus size and position, no adnexal masses or tenderness.  Chaperoned exam.   She did not have discomfort on initial normal speculum placement but had discomfort when it was carefully opened a small amount to view the cervix.  She was able to get repositioned and tolerate speculum placement.  I again opened the speculum small amount to visualize her cervix which was retroverted.  With a small amount of manipulation this was centered for exam.  She tolerated that without pain.  Cervix appeared normal.  No bleeding.  Pap collected per routine with a very small amount (approximately 2 drops) of blood noted with Cytobrush use.  The Cytobrush was clearly placed in the os per routine and did not contact the epithelium otherwise.  Speculum was removed without incident and she had normal bimanual exam.

## 2021-05-29 NOTE — Patient Instructions (Addendum)
Take care.  Glad to see you. Please ask Dr. Neale Burly about magnesium.   We'll check with Dr. Neale Burly about med options.   Update me as needed.

## 2021-05-30 ENCOUNTER — Telehealth: Payer: Self-pay

## 2021-05-30 NOTE — Assessment & Plan Note (Signed)
Tetanus 2013 Flu 2021 covid 2021 PNA not due. Shingles d/w pt.   Colonoscopy 06/25/17 with 10 year f/u.   Mammogram 2021 Living will d/w pt.  Husband designated if patient were incapacitated.  DXA- done prev per ortho.   Diet and exercise d/w pt.  She has been using a home exercise bike and strength training.  Diet is good.   [facility] since [facility].   Pap 2022-see above regarding Pap exam.

## 2021-05-30 NOTE — Telephone Encounter (Signed)
Pt left v/m that was seen 05/29/21 for annual exam with pap smear. 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 At 4:30 AM pt said she was "bleeding profusely from vagina". Pt said this has never happened following pelvic exam before. Pt said now having slight red blood from vagina with cramping on and off. I spoke with pt;Pt LMP was last fall;pt is presently on birth control and has not missed med. After pelvic exam pt has never bled before and when had menstrual period did not bleed that heavy, pt said the vaginal breeding has lessened a lot but still has some bright red bleeding. Pt feels normal except some slight lower abd mid cramping that comes and goes and when has cramping pain level is 1-2. Pt said last pap was 5 yrs ago. No sexual encounter 05/29/21. Pt said during the pelvic exam pt did have some pain; pt said was some spotting after exam. Pt went home cleaned up and no more spotting. Pt request cb after Dr Para March reviews this note.  UC & ED precautions given and pt voiced understanding.

## 2021-05-30 NOTE — Assessment & Plan Note (Signed)
We will check with Dr. Neale Burly about ongoing OCP use.  We talked about her likely approaching menopause and the goal would be to eventually and very slowly taper her hormone treatment.  We did not make any changes at this point.

## 2021-05-30 NOTE — Telephone Encounter (Signed)
Late entry.  I called patient earlier this afternoon when I got this message.  She had an episode of bleeding at night after she had her exam.  Bleeding has clearly tapered off in the meantime.  She is not having severe abdominal pain or fever or vaginal discharge.  She had some abdominal cramping.  She is not lightheaded.  Exam discussed.  I think the most likely explanation is that she has perimenopausal bleeding but not postmenopausal bleeding.  There was nothing about the exam yesterday that should have caused a significant amount of blood loss.  She agreed.  The options would be to observe for now (awaiting her Pap smear) or send her for pelvic ultrasound.  We agreed that since she was improved it was reasonable to observe, await her Pap smear results, and she will update me if she has any more symptoms.  I thank you for taking the call.  ===================  Jessica-please call Dr. [facility].  We need his input on slowly tapering her OCPs at some point.  She likely had an episode of perimenopausal bleeding.  Also please call the patient about abx use.  It may be that Cipro is a reasonable option accounting for the cautions discussed Tuesday at the visit.  I would only use it as needed.  It may be the best option given her previous intolerances/allergies.  If she wants me to send the prescription for Cipro then please let me know.  Please let him know if she is had more bleeding or abdominal symptoms in the meantime.  Thanks.

## 2021-05-30 NOTE — Assessment & Plan Note (Signed)
Living will d/w pt.  Husband designated if patient were incapacitated.  

## 2021-05-31 LAB — CYTOLOGY - PAP
Comment: NEGATIVE
Diagnosis: NEGATIVE
High risk HPV: NEGATIVE

## 2021-05-31 NOTE — Telephone Encounter (Signed)
Spoke with patient and she is doing fine now; states she has very little bleeding now and abd cramping is gone. Patient is great with Cipro and would [facility]   I called and left a message and Dr. Onnie Boer office for input about tapering patients OCP's in the future.

## 2021-06-01 MED ORDER — CIPROFLOXACIN HCL 500 MG PO TABS: 500.0000 mg | ORAL_TABLET | Freq: Every day | ORAL | 1 refills | Status: DC | PRN

## 2021-06-01 NOTE — Addendum Note (Signed)
Addended by: Joaquim Nam on: 06/01/2021 07:48 AM   Modules accepted: Orders

## 2021-06-01 NOTE — Telephone Encounter (Signed)
Noted. Thanks.  I sent the rx for 250mg  tabs.  Would start with that.

## 2021-06-05 NOTE — Telephone Encounter (Signed)
Wilburn Cornelia, CMA  You 22 hours ago (10:32 AM)     [facility] called back and stated that patient is at the maximum medical therapy at their office for migraines. They are unable to predict if discontinuing the OCP's will worsen patients migraines. If it is decided to discontinue OCP they will monitor patients status.   ==================== Please notify pt.  I wouldn't start the taper yet but this is likely reasonable to consider in the next year or two.  Thanks.

## 2021-06-05 NOTE — Telephone Encounter (Signed)
Patient advised about discussion with Dr. Para March and the wellness center. Patient is okay with considering this in the future.

## 2021-06-26 DIAGNOSIS — M542 Cervicalgia: Secondary | ICD-10-CM | POA: Diagnosis not present

## 2021-06-26 DIAGNOSIS — G43719 Chronic migraine without aura, intractable, without status migrainosus: Secondary | ICD-10-CM | POA: Diagnosis not present

## 2021-06-26 DIAGNOSIS — G43019 Migraine without aura, intractable, without status migrainosus: Secondary | ICD-10-CM | POA: Diagnosis not present

## 2021-07-24 DIAGNOSIS — G43719 Chronic migraine without aura, intractable, without status migrainosus: Secondary | ICD-10-CM | POA: Diagnosis not present

## 2021-07-24 DIAGNOSIS — M542 Cervicalgia: Secondary | ICD-10-CM | POA: Diagnosis not present

## 2021-07-24 DIAGNOSIS — G43019 Migraine without aura, intractable, without status migrainosus: Secondary | ICD-10-CM | POA: Diagnosis not present

## 2021-07-30 ENCOUNTER — Other Ambulatory Visit: Payer: Self-pay

## 2021-07-30 ENCOUNTER — Encounter: Payer: Self-pay | Admitting: Medical

## 2021-07-30 ENCOUNTER — Ambulatory Visit: Payer: BC Managed Care – PPO | Admitting: Medical

## 2021-07-30 VITALS — BP 141/83 | HR 105 | Temp 100.1°F | Resp 16 | Ht 61.0 in | Wt 145.0 lb

## 2021-07-30 DIAGNOSIS — Z1152 Encounter for screening for COVID-19: Secondary | ICD-10-CM

## 2021-07-30 LAB — POC COVID19 BINAXNOW: SARS Coronavirus 2 Ag: NEGATIVE

## 2021-07-30 NOTE — Progress Notes (Signed)
   Subjective:    Patient ID: Margaret Brewer, female    DOB: [birth year], 55 y.o.   MRN: [id number]  HPI 55 yo female in non acute distress, here for  PCR Covid testing. She is deemed a close contact by [employer]. Her at home point of care test was Negative.  Symptoms started on Saturday with  sore throat, cough runny nose , congestion, HA, body aches she denied fever however in clinic she was 100.1 Taking Mucinex.  Husband tested postitive on Friday 12.   PMH Broncitis  Blood pressure (!) 141/83, pulse (!) 105, temperature 100.1 F (37.8 C), temperature source Oral, resp. rate 16, height 5\' 1"  (1.549 m), weight 145 lb (65.8 kg), SpO2 100 %.   Review of Systems  Constitutional:  Positive for fatigue. Negative for chills and fever.  HENT:  Positive for congestion, postnasal drip, rhinorrhea, sneezing and sore throat. Negative for ear pain, sinus pressure and sinus pain.   Respiratory:  Positive for cough (non productve). Negative for shortness of breath.   Cardiovascular:  Negative for chest pain.  Gastrointestinal:  Negative for abdominal pain and diarrhea.  Musculoskeletal:  Positive for myalgias.  Neurological:  Positive for headaches. Negative for dizziness, syncope and light-headedness.      Objective:   Physical Exam Vitals and nursing note reviewed.  Constitutional:      Appearance: She is well-developed.  HENT:     Head: Normocephalic and atraumatic.     Right Ear: Ear canal normal. A middle ear effusion is present.     Left Ear: Ear canal normal. A middle ear effusion is present.     Mouth/Throat:     Mouth: Mucous membranes are moist.     Pharynx: Oropharynx is clear. Uvula midline.     Tonsils: No tonsillar exudate or tonsillar abscesses. 0 on the right. 0 on the left.  Eyes:     Conjunctiva/sclera: Conjunctivae normal.     Pupils: Pupils are equal, round, and reactive to light.  Cardiovascular:     Rate and Rhythm: Normal rate and regular rhythm.   Pulmonary:     Effort: Pulmonary effort is normal.     Breath sounds: Normal breath sounds.  Musculoskeletal:     Cervical back: Normal range of motion and neck supple.  Skin:    General: Skin is warm and dry.  Neurological:     General: No focal deficit present.     Mental Status: She is alert and oriented to person, place, and time.  Psychiatric:        Mood and Affect: Mood normal.        Behavior: Behavior normal.      POCT Covid-19 Negative.    Assessment & Plan:  Encounter for covied screening Covid  PCR Pending, Due to exposure and symptoms I would classify this paatient as positive for Covid-19. Isolate, Rest, increase fluids. Monitor temperature, take OTC Motrin or Tylenol per package instructions. Return to work date is  8/19 /2022. Patient verbalizes understanding and has no questions at discharge.

## 2021-07-30 NOTE — Patient Instructions (Signed)
COVID-19: What to Do if You Are Sick CDC has updated isolation and quarantine recommendations for the public, and is revising the CDC website to reflect these changes. These recommendations do not apply to healthcare personnel and do not supersede state, local, tribal, or territorial laws, rules, andregulations. If you have a fever, cough or other symptoms, you might have COVID-19. Most people have mild illness and are able to recover at home. If you are sick: Keep track of your symptoms. If you have an emergency warning sign (including trouble breathing), call 911. Steps to help prevent the spread of COVID-19 if you are sick If you are sick with COVID-19 or think you might have COVID-19, follow the steps below to care for yourself and to help protect other peoplein your home and community. Stay home except to get medical care Stay home. Most people with COVID-19 have mild illness and can recover at home without medical care. Do not leave your home, except to get medical care. Do not visit public areas. Take care of yourself. Get rest and stay hydrated. Take over-the-counter medicines, such as acetaminophen, to help you feel better. Stay in touch with your doctor. Call before you get medical care. Be sure to get care if you have trouble breathing, or have any other emergency warning signs, or if you think it is an emergency. Avoid public transportation, ride-sharing, or taxis. Separate yourself from other people As much as possible, stay in a specific room and away from other people and pets in your home. If possible, you should use a separate bathroom. If you need to be around other people or animals in oroutside of the home, wear a mask. Tell your close contactsthat they may have been exposed to COVID-19. An infected person can spread COVID-19 starting 48 hours (or 2 days) before the person has any symptoms or tests positive. By letting your close contacts know they may have been exposed to COVID-19,  you are helping to protect everyone. Additional guidance is available for those living in close quarters and shared housing. See COVID-19 and Animals if you have questions about pets. If you are diagnosed with COVID-19, someone from the health department may call you. Answer the call to slow the spread. Monitor your symptoms Symptoms of COVID-19 include fever, cough, or other symptoms. Follow care instructions from your healthcare provider and local health department. Your local health authorities may give instructions on checking your symptoms and reporting information. When to seek emergency medical attention Look for emergency warning signs* for COVID-19. If someone is showing any of these signs, seek emergency medical care immediately: Trouble breathing Persistent pain or pressure in the chest New confusion Inability to wake or stay awake Pale, gray, or blue-colored skin, lips, or nail beds, depending on skin tone *This list is not all possible symptoms. Please call your medical provider forany other symptoms that are severe or concerning to you. Call 911 or call ahead to your local emergency facility: Notify the operator that you are seeking care for someone who has or may haveCOVID-19. Call ahead before visiting your doctor Call ahead. Many medical visits for routine care are being postponed or done by phone or telemedicine. If you have a medical appointment that cannot be postponed, call your doctor's office, and tell them you have or may have COVID-19. This will help the office protect themselves and other patients. Get tested If you have symptoms of COVID-19, get tested. While waiting for test results, you stay away from others,   including staying apart from those living in your household. Self-tests are one of several options for testing for the virus that causes COVID-19 and may be more convenient than laboratory-based tests and point-of-care tests. Ask your healthcare provider or  your local health department if you need help interpreting your test results. You can visit your state, tribal, local, and territorial health department's website to look for the latest local information on testing sites. If you are sick, wear a mask over your nose and mouth You should wear a mask over your nose and mouth if you must be around other people or animals, including pets (even at home). You don't need to wear the mask if you are alone. If you can't put on a mask (because of trouble breathing, for example), cover your coughs and sneezes in some other way. Try to stay at least 6 feet away from other people. This will help protect the people around you. Masks should not be placed on young children under age 2 years, anyone who has trouble breathing, or anyone who is not able to remove the mask without help. Note: During the COVID-19 pandemic, medical grade facemasks are reserved forhealthcare workers and some first responders. Cover your coughs and sneezes Cover your mouth and nose with a tissue when you cough or sneeze. Throw away used tissues in a lined trash can. Immediately wash your hands with soap and water for at least 20 seconds. If soap and water are not available, clean your hands with an alcohol-based hand sanitizer that contains at least 60% alcohol. Clean your hands often Wash your hands often with soap and water for at least 20 seconds. This is especially important after blowing your nose, coughing, or sneezing; going to the bathroom; and before eating or preparing food. Use hand sanitizer if soap and water are not available. Use an alcohol-based hand sanitizer with at least 60% alcohol, covering all surfaces of your hands and rubbing them together until they feel dry. Soap and water are the best option, especially if hands are visibly dirty. Avoid touching your eyes, nose, and mouth with unwashed hands. Handwashing Tips Avoid sharing personal household items Do not share  dishes, drinking glasses, cups, eating utensils, towels, or bedding with other people in your home. Wash these items thoroughly after using them with soap and water or put in the dishwasher. Clean all "high-touch" surfaces every day Clean and disinfect high-touch surfaces in your "sick room" and bathroom; wear disposable gloves. Let someone else clean and disinfect surfaces in common areas, but you should clean your bedroom and bathroom, if possible. If a caregiver or other person needs to clean and disinfect a sick person's bedroom or bathroom, they should do so on an as-needed basis. The caregiver/other person should wear a mask and disposable gloves prior to cleaning. They should wait as long as possible after the person who is sick has used the bathroom before coming in to clean and use the bathroom. High-touch surfaces include phones, remote controls, counters, tabletops, doorknobs, bathroom fixtures, toilets, keyboards, tablets, and bedside tables. Clean and disinfect areas that may have blood, stool, or body fluids on them. Use household cleaners and disinfectants. Clean the area or item with soap and water or another detergent if it is dirty. Then, use a household disinfectant. Be sure to follow the instructions on the label to ensure safe and effective use of the product. Many products recommend keeping the surface wet for several minutes to ensure germs are killed. Many   also recommend precautions such as wearing gloves and making sure you have good ventilation during use of the product. Use a product from EPA's List N: Disinfectants for Coronavirus (COVID-19). Complete Disinfection Guidance When you can be around others after being sick with COVID-19 Deciding when you can be around others is different for different situations. Find out when you can safely end home isolation. For any additional questions about your care,contact your healthcare provider or state or local health  department. 11/22/2020 Content source: National Center for Immunization and Respiratory Diseases (NCIRD), Division of Viral Diseases This information is not intended to replace advice given to you by your health care provider. Make sure you discuss any questions you have with your healthcare provider. Document Revised: 01/19/2021 Document Reviewed: 01/19/2021 Elsevier Patient Education  2022 Elsevier Inc.  

## 2021-07-31 ENCOUNTER — Other Ambulatory Visit: Payer: Self-pay | Admitting: Medical

## 2021-07-31 ENCOUNTER — Telehealth: Payer: BC Managed Care – PPO | Admitting: Medical

## 2021-07-31 DIAGNOSIS — R059 Cough, unspecified: Secondary | ICD-10-CM

## 2021-07-31 LAB — SARS-COV-2, NAA 2 DAY TAT

## 2021-07-31 LAB — NOVEL CORONAVIRUS, NAA: SARS-CoV-2, NAA: DETECTED — AB

## 2021-07-31 MED ORDER — BENZONATATE 100 MG PO CAPS: ORAL_CAPSULE | ORAL | 0 refills | Status: DC

## 2021-07-31 NOTE — Progress Notes (Signed)
  I called patient to inform her that her PCR was positive for Covid-19.  She has no fever today but increased coughing, runny nose.  Her return to work date is 08/03/21 however if she is not feeling better she is to call the office and we will reevaluate her symptoms and update her work note if needed.  Patient says she has had a hard time lsleeping last night  due to cough.  She is to stop her decongestant with cough medication dextromethorphan and to take the tessalon perles as needed for cough every  8 hours..She verbalizes understanding and then for her congestion of her  nose she may take phenylephrine.And for fever or if not feeling well Ibuprofen per package instructions.  Meds ordered this encounter  Medications   DISCONTD: benzonatate (TESSALON PERLES) 100 MG capsule    Sig: 1-2 capsules    Dispense:  30 capsule    Refill:  0   benzonatate (TESSALON PERLES) 100 MG capsule    Sig: 1-2 capsules every 8 hours as needed for cough    Dispense:  30 capsule    Refill:  0    She verbalizes understanding of all symptoms and has no questions at the end of our conversation.

## 2021-08-21 DIAGNOSIS — M542 Cervicalgia: Secondary | ICD-10-CM | POA: Diagnosis not present

## 2021-08-21 DIAGNOSIS — G43719 Chronic migraine without aura, intractable, without status migrainosus: Secondary | ICD-10-CM | POA: Diagnosis not present

## 2021-08-21 DIAGNOSIS — G43019 Migraine without aura, intractable, without status migrainosus: Secondary | ICD-10-CM | POA: Diagnosis not present

## 2021-09-04 NOTE — Progress Notes (Signed)
Patient ID: Margaret Brewer, female   DOB: 12-15-1966, 55 y.o.   MRN: [id number]     I called patient to inform her that her PCR was positive for Covid-19.   She has no fever today but increased coughing, runny nose.   Her return to work date is 08/03/21 however if she is not feeling better she is to call the office and we will reevaluate her symptoms and update her work note if needed.   Patient says she has had a hard time lsleeping last night  due to cough.  She is to stop her decongestant with cough medication dextromethorphan and to take the tessalon perles as needed for cough every  8 hours..She verbalizes understanding and then for her congestion of her  nose she may take phenylephrine.And for fever or if not feeling well Ibuprofen per package instructions.       Meds ordered this encounter  Medications   DISCONTD: benzonatate (TESSALON PERLES) 100 MG capsule      Sig: 1-2 capsules      Dispense:  30 capsule      Refill:  0   benzonatate (TESSALON PERLES) 100 MG capsule      Sig: 1-2 capsules every 8 hours as needed for cough      Dispense:  30 capsule      Refill:  0    She verbalizes understanding of all symptoms and has no questions at the end of our conversation.

## 2021-09-12 ENCOUNTER — Other Ambulatory Visit: Payer: Self-pay

## 2021-09-12 ENCOUNTER — Ambulatory Visit: Payer: BC Managed Care – PPO

## 2021-09-12 DIAGNOSIS — Z23 Encounter for immunization: Secondary | ICD-10-CM

## 2021-09-17 ENCOUNTER — Other Ambulatory Visit: Payer: Self-pay | Admitting: Family Medicine

## 2021-09-17 DIAGNOSIS — Z1231 Encounter for screening mammogram for malignant neoplasm of breast: Secondary | ICD-10-CM

## 2021-09-18 DIAGNOSIS — M542 Cervicalgia: Secondary | ICD-10-CM | POA: Diagnosis not present

## 2021-09-18 DIAGNOSIS — G43019 Migraine without aura, intractable, without status migrainosus: Secondary | ICD-10-CM | POA: Diagnosis not present

## 2021-09-18 DIAGNOSIS — G43719 Chronic migraine without aura, intractable, without status migrainosus: Secondary | ICD-10-CM | POA: Diagnosis not present

## 2021-09-27 ENCOUNTER — Other Ambulatory Visit: Payer: Self-pay

## 2021-09-27 ENCOUNTER — Encounter: Payer: Self-pay | Admitting: Nurse Practitioner

## 2021-09-27 ENCOUNTER — Ambulatory Visit: Payer: BC Managed Care – PPO | Admitting: Nurse Practitioner

## 2021-09-27 VITALS — BP 102/68 | HR 88 | Temp 98.4°F | Resp 16

## 2021-09-27 DIAGNOSIS — N3090 Cystitis, unspecified without hematuria: Secondary | ICD-10-CM

## 2021-09-27 DIAGNOSIS — R300 Dysuria: Secondary | ICD-10-CM

## 2021-09-27 LAB — POCT URINALYSIS DIPSTICK OB
Bilirubin, UA: NEGATIVE
Blood, UA: NEGATIVE
Glucose, UA: NEGATIVE
Ketones, UA: NEGATIVE
Nitrite, UA: NEGATIVE
POC,PROTEIN,UA: NEGATIVE
Spec Grav, UA: 1.01 (ref 1.010–1.025)
Urobilinogen, UA: 0.2 E.U./dL
pH, UA: 7 (ref 5.0–8.0)

## 2021-09-27 MED ORDER — CIPROFLOXACIN HCL 500 MG PO TABS: 500.0000 mg | ORAL_TABLET | Freq: Two times a day (BID) | ORAL | 0 refills | Status: AC

## 2021-09-27 NOTE — Progress Notes (Signed)
   Subjective:    Patient ID: Margaret Brewer, female    DOB: 1966/06/19, 55 y.o.   MRN: [id number]  HPI  55 year old female presenting to [facility] with complaints of urgency and frequency symptoms were present over past weekend while traveling- improved and then returned for the past two days.  Denies fever/back pain or N/V   Has had good effects with Cipro in the past has multiple drug allergies.       Allergies  Allergen Reactions   Cephalexin     rash   Erythromycin     GI upset   Nitrofurantoin     rash   Omeprazole Other (See Comments)    Migraines worse on med   Penicillins     rash   Sulfonamide Derivatives     hives   Vicks Vaporub [Camph-Eucalypt-Men-Turp-Pet] Rash    Skin rash       Review of Systems  Constitutional: Negative.   HENT: Negative.    Respiratory: Negative.    Cardiovascular: Negative.   Genitourinary:  Positive for frequency and urgency.  Neurological: Negative.   Psychiatric/Behavioral: Negative.        Objective:   Physical Exam HENT:     Head: Normocephalic.  Pulmonary:     Effort: Pulmonary effort is normal.  Neurological:     General: No focal deficit present.     Mental Status: She is alert.  Psychiatric:        Mood and Affect: Mood normal.          Assessment & Plan:  1. Dysuria  - POC Urinalysis Dipstick OB   2. Cystitis Meds ordered this encounter  Medications   ciprofloxacin (CIPRO) 500 MG tablet    Sig: Take 1 tablet (500 mg total) by mouth 2 (two) times daily for 3 days.    Dispense:  6 tablet    Refill:  0    Increase water intake avoid alcohol sugar and caffeine  Dicussed risk of taking Cipro/ avoid exercise and strenuous activities while taking medication.    RTC with ongoing symptoms or new concerns

## 2021-10-04 ENCOUNTER — Ambulatory Visit
Admission: RE | Admit: 2021-10-04 | Discharge: 2021-10-04 | Disposition: A | Discharge status: Home / Self Care | Payer: BC Managed Care – PPO | Source: Ambulatory Visit | Attending: Family Medicine | Admitting: Family Medicine

## 2021-10-04 ENCOUNTER — Other Ambulatory Visit: Payer: Self-pay

## 2021-10-04 DIAGNOSIS — Z1231 Encounter for screening mammogram for malignant neoplasm of breast: Secondary | ICD-10-CM | POA: Diagnosis not present

## 2021-10-09 ENCOUNTER — Other Ambulatory Visit: Payer: Self-pay | Admitting: Family Medicine

## 2021-10-09 DIAGNOSIS — N6310 Unspecified lump in the right breast, unspecified quadrant: Secondary | ICD-10-CM

## 2021-10-09 DIAGNOSIS — R928 Other abnormal and inconclusive findings on diagnostic imaging of breast: Secondary | ICD-10-CM

## 2021-10-17 DIAGNOSIS — G43719 Chronic migraine without aura, intractable, without status migrainosus: Secondary | ICD-10-CM | POA: Diagnosis not present

## 2021-10-17 DIAGNOSIS — G43019 Migraine without aura, intractable, without status migrainosus: Secondary | ICD-10-CM | POA: Diagnosis not present

## 2021-10-17 DIAGNOSIS — M542 Cervicalgia: Secondary | ICD-10-CM | POA: Diagnosis not present

## 2021-10-19 ENCOUNTER — Ambulatory Visit
Admission: RE | Admit: 2021-10-19 | Discharge: 2021-10-19 | Disposition: A | Discharge status: Home / Self Care | Payer: BC Managed Care – PPO | Source: Ambulatory Visit | Attending: Family Medicine | Admitting: Family Medicine

## 2021-10-19 ENCOUNTER — Other Ambulatory Visit: Payer: Self-pay

## 2021-10-19 DIAGNOSIS — N6310 Unspecified lump in the right breast, unspecified quadrant: Secondary | ICD-10-CM | POA: Insufficient documentation

## 2021-10-19 DIAGNOSIS — R928 Other abnormal and inconclusive findings on diagnostic imaging of breast: Secondary | ICD-10-CM

## 2021-10-19 DIAGNOSIS — R922 Inconclusive mammogram: Secondary | ICD-10-CM | POA: Diagnosis not present

## 2021-11-13 DIAGNOSIS — G43019 Migraine without aura, intractable, without status migrainosus: Secondary | ICD-10-CM | POA: Diagnosis not present

## 2021-11-13 DIAGNOSIS — G43719 Chronic migraine without aura, intractable, without status migrainosus: Secondary | ICD-10-CM | POA: Diagnosis not present

## 2021-12-05 DIAGNOSIS — G43719 Chronic migraine without aura, intractable, without status migrainosus: Secondary | ICD-10-CM | POA: Diagnosis not present

## 2021-12-05 DIAGNOSIS — G43019 Migraine without aura, intractable, without status migrainosus: Secondary | ICD-10-CM | POA: Diagnosis not present

## 2021-12-05 DIAGNOSIS — M542 Cervicalgia: Secondary | ICD-10-CM | POA: Diagnosis not present

## 2022-01-08 DIAGNOSIS — G43019 Migraine without aura, intractable, without status migrainosus: Secondary | ICD-10-CM | POA: Diagnosis not present

## 2022-01-08 DIAGNOSIS — G43719 Chronic migraine without aura, intractable, without status migrainosus: Secondary | ICD-10-CM | POA: Diagnosis not present

## 2022-01-08 DIAGNOSIS — M542 Cervicalgia: Secondary | ICD-10-CM | POA: Diagnosis not present

## 2022-02-05 DIAGNOSIS — G43019 Migraine without aura, intractable, without status migrainosus: Secondary | ICD-10-CM | POA: Diagnosis not present

## 2022-02-05 DIAGNOSIS — G43719 Chronic migraine without aura, intractable, without status migrainosus: Secondary | ICD-10-CM | POA: Diagnosis not present

## 2022-02-05 DIAGNOSIS — M542 Cervicalgia: Secondary | ICD-10-CM | POA: Diagnosis not present

## 2022-03-05 DIAGNOSIS — G43019 Migraine without aura, intractable, without status migrainosus: Secondary | ICD-10-CM | POA: Diagnosis not present

## 2022-03-05 DIAGNOSIS — G43719 Chronic migraine without aura, intractable, without status migrainosus: Secondary | ICD-10-CM | POA: Diagnosis not present

## 2022-03-05 DIAGNOSIS — M542 Cervicalgia: Secondary | ICD-10-CM | POA: Diagnosis not present

## 2022-04-03 DIAGNOSIS — G43019 Migraine without aura, intractable, without status migrainosus: Secondary | ICD-10-CM | POA: Diagnosis not present

## 2022-04-03 DIAGNOSIS — M542 Cervicalgia: Secondary | ICD-10-CM | POA: Diagnosis not present

## 2022-04-03 DIAGNOSIS — G43719 Chronic migraine without aura, intractable, without status migrainosus: Secondary | ICD-10-CM | POA: Diagnosis not present

## 2022-04-13 IMAGING — MG MM DIGITAL SCREENING BILAT W/ TOMO AND CAD
8 series · 8 of 24 positions shown · non-contrast
Comparison: Previous exam(s).

CLINICAL DATA: Screening.

EXAM:
DIGITAL SCREENING BILATERAL MAMMOGRAM WITH TOMOSYNTHESIS AND CAD
TECHNIQUE: Bilateral screening digital craniocaudal and mediolateral oblique
mammograms were obtained. Bilateral screening digital breast
tomosynthesis was performed. The images were evaluated with
computer-aided detection.

[R CC synth-2D]
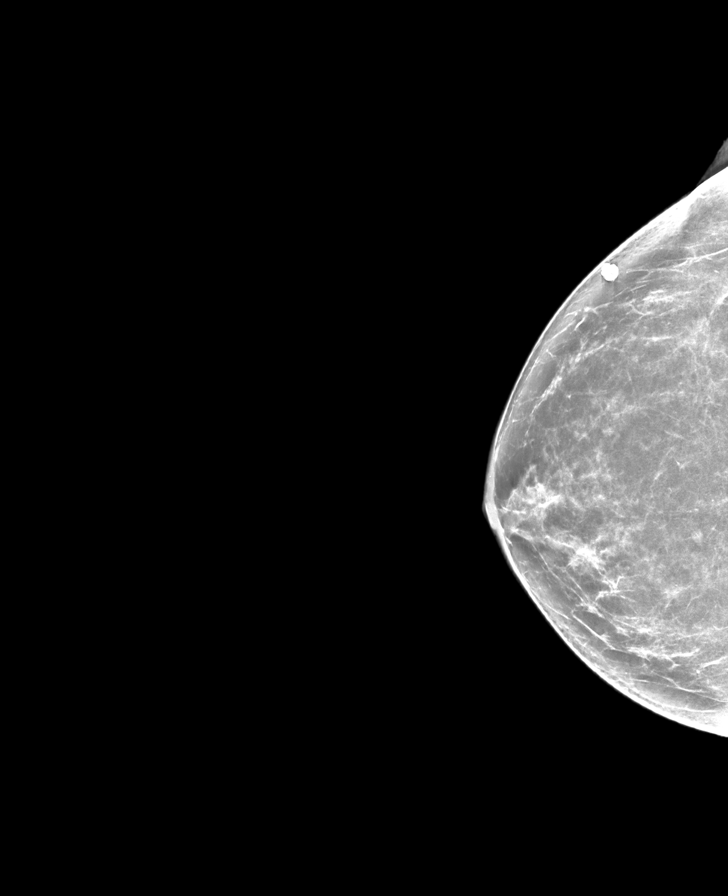

[R MLO synth-2D]
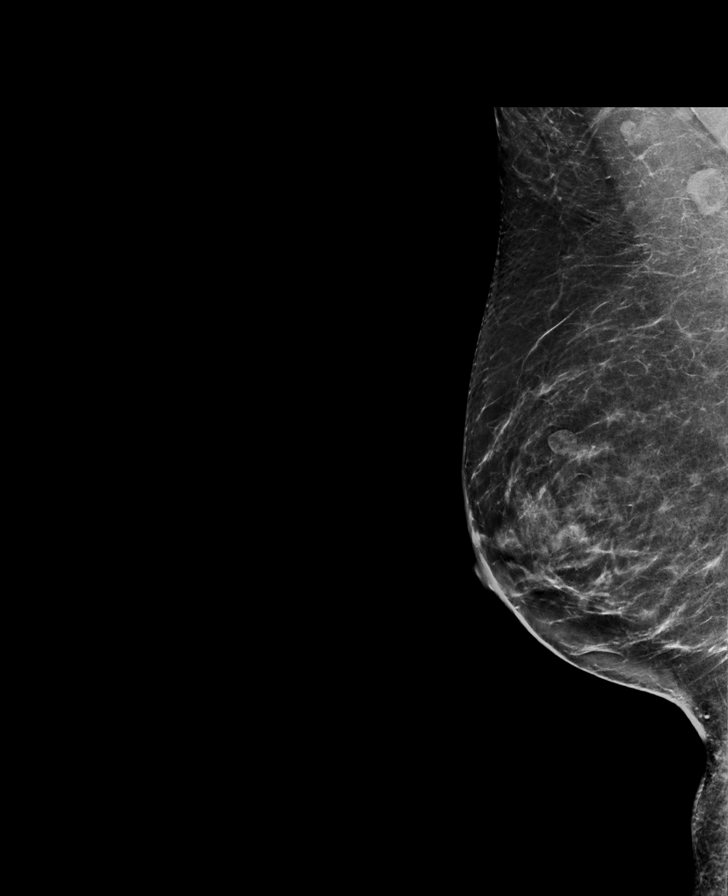

[L CC synth-2D]
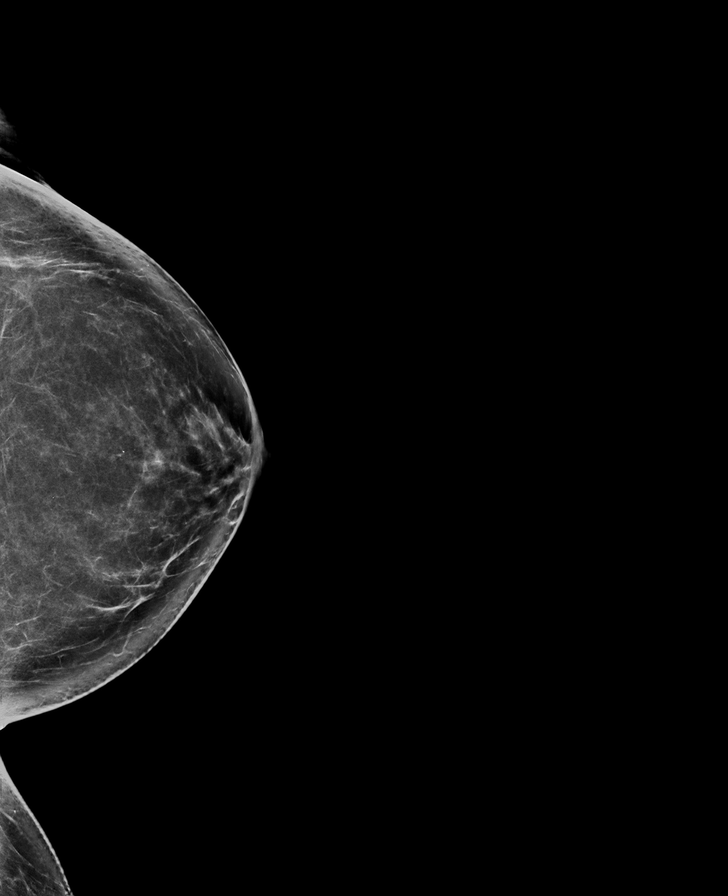

[L MLO synth-2D]
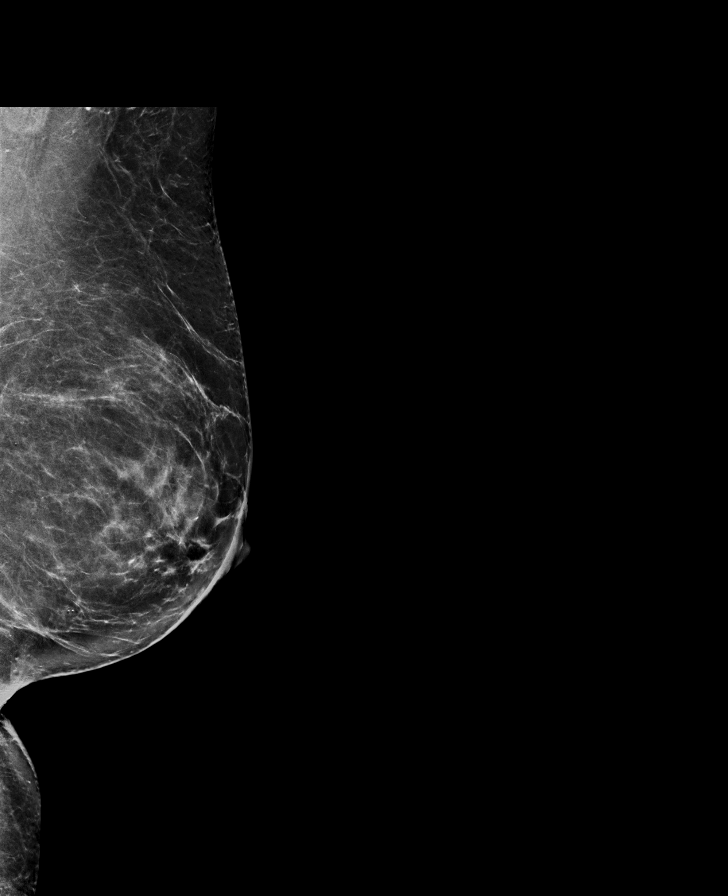

[L MLO tomo · tomo slice 39/76.0]
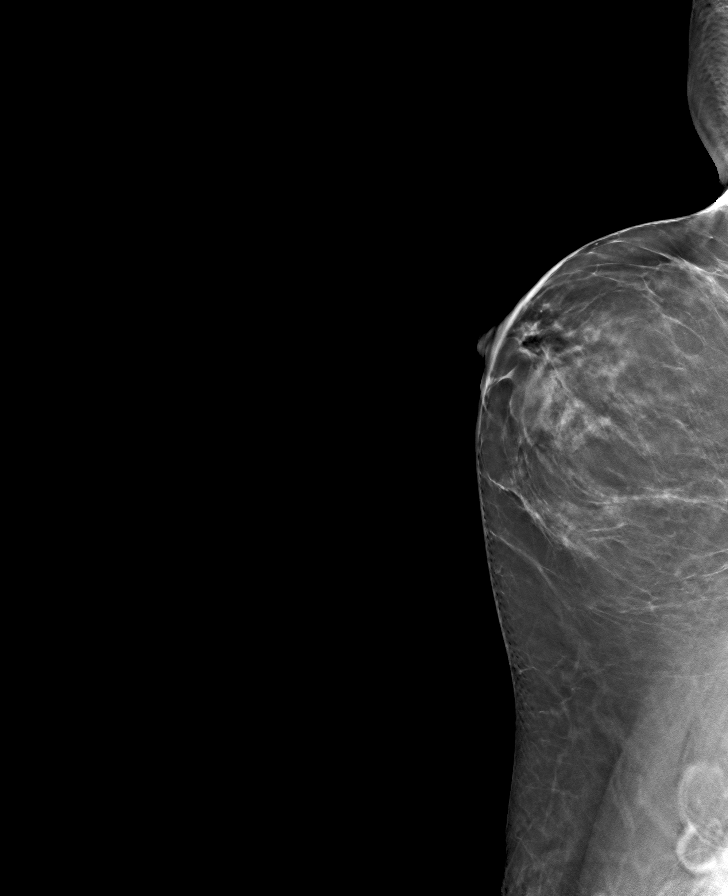

[R MLO tomo · tomo slice 39/77.0]
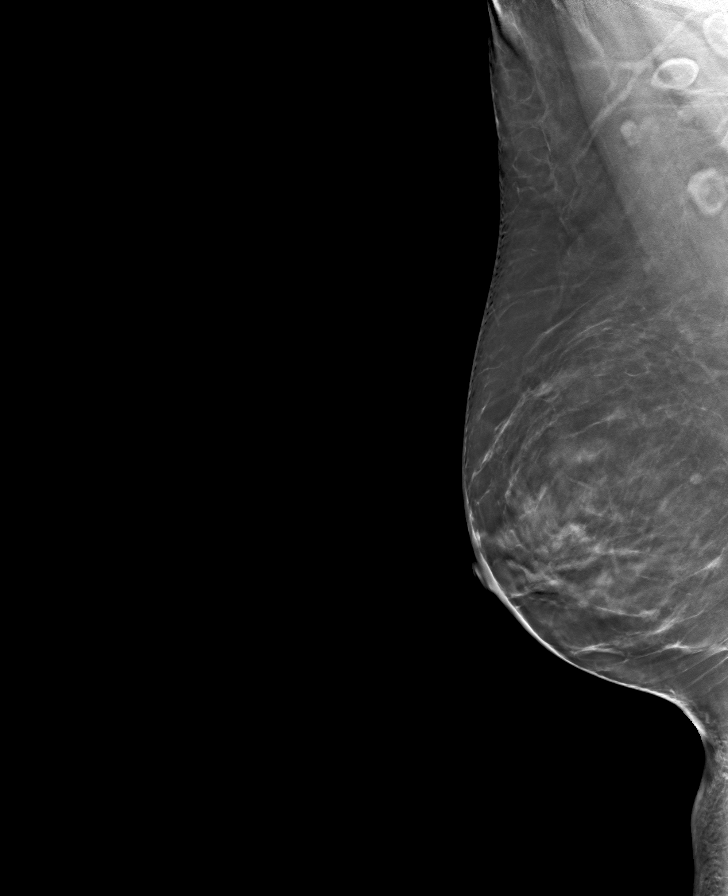

[R CC tomo · tomo slice 37/74.0]
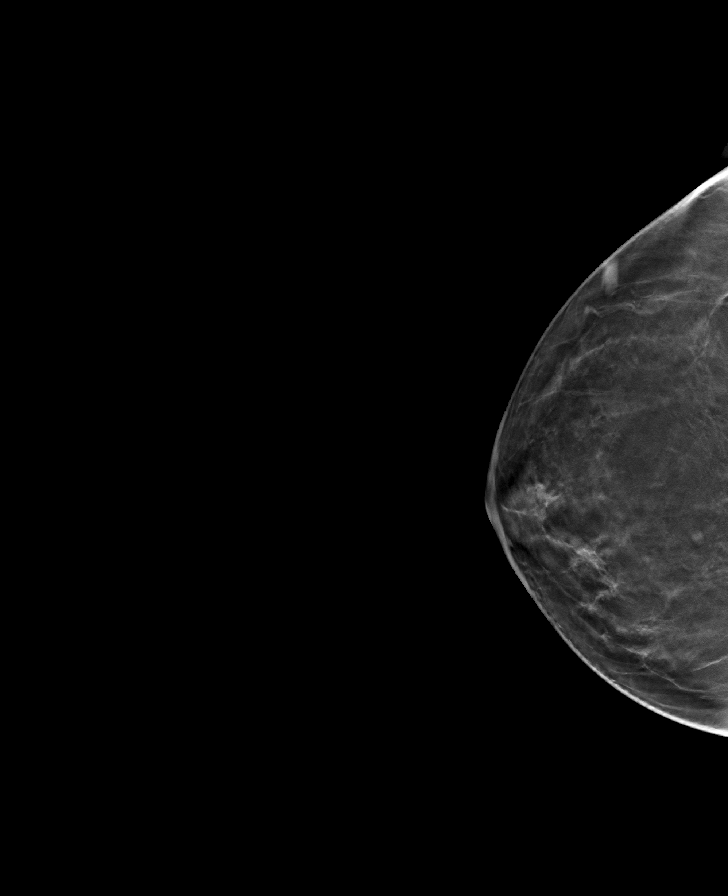

[L CC tomo · tomo slice 39/78.0]
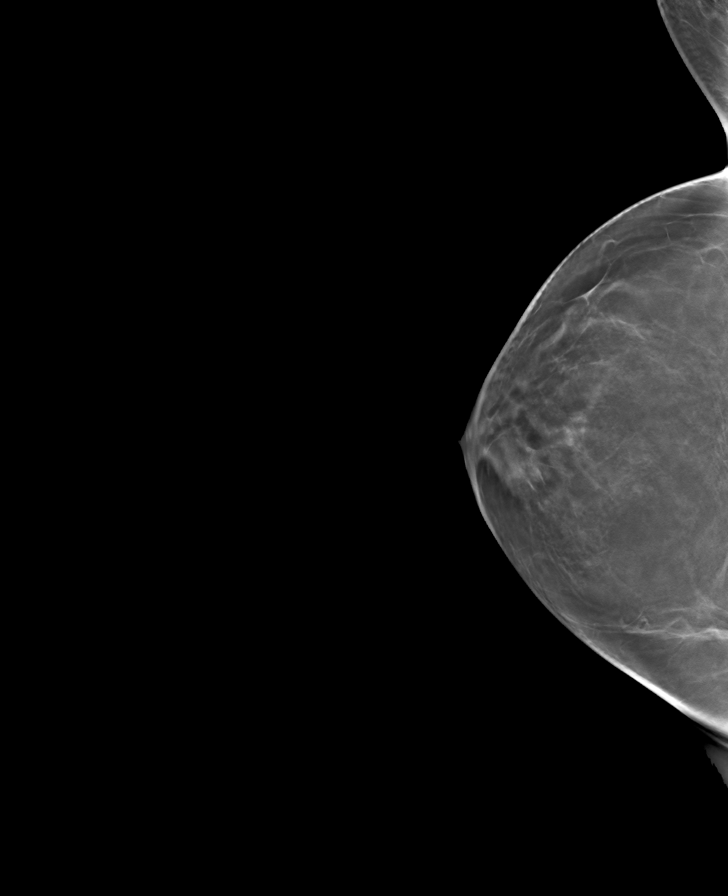

[8 of 24 positions shown; findings below may reference images not displayed]

ACR Breast Density Category b: There are scattered areas of
fibroglandular density.
FINDINGS: In the right breast, a possible mass warrants further evaluation. In
the left breast, no findings suspicious for malignancy.
IMPRESSION: Further evaluation is suggested for a possible mass in the right
breast.

RECOMMENDATION:
Diagnostic mammogram and possibly ultrasound of the right breast.
(Code:[id number])

The patient will be contacted regarding the findings, and additional
imaging will be scheduled.

BI-RADS CATEGORY  0: Incomplete. Need additional imaging evaluation
and/or prior mammograms for comparison.

## 2022-04-17 ENCOUNTER — Other Ambulatory Visit: Payer: Self-pay | Admitting: Family Medicine

## 2022-05-07 DIAGNOSIS — G43019 Migraine without aura, intractable, without status migrainosus: Secondary | ICD-10-CM | POA: Diagnosis not present

## 2022-05-07 DIAGNOSIS — M542 Cervicalgia: Secondary | ICD-10-CM | POA: Diagnosis not present

## 2022-05-07 DIAGNOSIS — G43719 Chronic migraine without aura, intractable, without status migrainosus: Secondary | ICD-10-CM | POA: Diagnosis not present

## 2022-05-19 ENCOUNTER — Other Ambulatory Visit: Payer: Self-pay | Admitting: Family Medicine

## 2022-05-19 DIAGNOSIS — R7989 Other specified abnormal findings of blood chemistry: Secondary | ICD-10-CM

## 2022-05-30 ENCOUNTER — Other Ambulatory Visit (INDEPENDENT_AMBULATORY_CARE_PROVIDER_SITE_OTHER): Payer: BC Managed Care – PPO

## 2022-05-30 DIAGNOSIS — R7989 Other specified abnormal findings of blood chemistry: Secondary | ICD-10-CM | POA: Diagnosis not present

## 2022-05-30 LAB — LIPID PANEL
Cholesterol: 214 mg/dL — ABNORMAL HIGH (ref 0–200)
HDL: 90.4 mg/dL (ref >39.00)
LDL Cholesterol: 108 mg/dL — ABNORMAL HIGH (ref 0–99)
NonHDL: 123.98
Total CHOL/HDL Ratio: 2
Triglycerides: 81 mg/dL (ref 0.0–149.0)
VLDL: 16.2 mg/dL (ref 0.0–40.0)

## 2022-05-30 LAB — COMPREHENSIVE METABOLIC PANEL
ALT: 22 U/L (ref 0–35)
AST: 19 U/L (ref 0–37)
Albumin: 4.2 g/dL (ref 3.5–5.2)
Alkaline Phosphatase: 36 U/L — ABNORMAL LOW (ref 39–117)
BUN: 11 mg/dL (ref 6–23)
CO2: 27 mEq/L (ref 19–32)
Calcium: 9.3 mg/dL (ref 8.4–10.5)
Chloride: 98 mEq/L (ref 96–112)
Creatinine, Ser: 0.7 mg/dL (ref 0.40–1.20)
GFR: 97.3 mL/min (ref >60.00)
Glucose, Bld: 88 mg/dL (ref 70–99)
Potassium: 3.9 mEq/L (ref 3.5–5.1)
Sodium: 133 mEq/L — ABNORMAL LOW (ref 135–145)
Total Bilirubin: 0.5 mg/dL (ref 0.2–1.2)
Total Protein: 6.9 g/dL (ref 6.0–8.3)

## 2022-06-04 DIAGNOSIS — M542 Cervicalgia: Secondary | ICD-10-CM | POA: Diagnosis not present

## 2022-06-04 DIAGNOSIS — G43719 Chronic migraine without aura, intractable, without status migrainosus: Secondary | ICD-10-CM | POA: Diagnosis not present

## 2022-06-04 DIAGNOSIS — G43019 Migraine without aura, intractable, without status migrainosus: Secondary | ICD-10-CM | POA: Diagnosis not present

## 2022-06-06 ENCOUNTER — Encounter: Payer: Self-pay | Admitting: Family Medicine

## 2022-06-06 ENCOUNTER — Ambulatory Visit (INDEPENDENT_AMBULATORY_CARE_PROVIDER_SITE_OTHER): Payer: BC Managed Care – PPO | Admitting: Family Medicine

## 2022-06-06 VITALS — BP 118/70 | HR 67 | Temp 97.9°F | Ht 61.5 in | Wt 149.5 lb

## 2022-06-06 DIAGNOSIS — Z23 Encounter for immunization: Secondary | ICD-10-CM

## 2022-06-06 DIAGNOSIS — Z0000 Encounter for general adult medical examination without abnormal findings: Secondary | ICD-10-CM

## 2022-06-06 DIAGNOSIS — G43009 Migraine without aura, not intractable, without status migrainosus: Secondary | ICD-10-CM

## 2022-06-06 DIAGNOSIS — Z7189 Other specified counseling: Secondary | ICD-10-CM

## 2022-06-06 MED ORDER — DESOGESTREL-ETHINYL ESTRADIOL 0.15-30 MG-MCG PO TABS: 1.0000 | ORAL_TABLET | Freq: Every day | ORAL | 3 refills | Status: DC

## 2022-06-06 NOTE — Patient Instructions (Addendum)
Update me as needed.  Take care.  Glad to see you.   

## 2022-06-06 NOTE — Progress Notes (Unsigned)
CPE- See plan.  Routine anticipatory guidance given to patient.  See health maintenance.  The possibility exists that previously documented standard health maintenance information may have been brought forward from a previous encounter into this note.  If needed, that same information has been updated to reflect the current situation based on today's encounter.    Tetanus 2013 Flu 2022 covid 2021 PNA not due. Shingles d/w pt.   Colonoscopy 06/25/17 with 10 year f/u.   Mammogram 2022 Living will d/w pt.  Husband designated if patient were incapacitated.  DXA- done prev per ortho.   Diet and exercise d/w pt.  She has been using a home exercise bike and strength training.  Diet is good.   [facility] since [facility].   Pap 2022  Migraines per neuro.  Still on OCPs at baseline.  D/w pt.  The plan is still to continue as is for now and then re-eval next year.  She is still having menses between packs.  She has less HA with current plan.    PMH and SH reviewed  Meds, vitals, and allergies reviewed.   ROS: Per HPI.  Unless specifically indicated otherwise in HPI, the patient denies:  General: fever. Eyes: acute vision changes ENT: sore throat Cardiovascular: chest pain Respiratory: SOB GI: vomiting GU: dysuria Musculoskeletal: acute back pain Derm: acute rash Neuro: acute motor dysfunction Psych: worsening mood Endocrine: polydipsia Heme: bleeding Allergy: hayfever  GEN: nad, alert and oriented HEENT: mucous membranes moist NECK: supple w/o LA CV: rrr. PULM: ctab, no inc wob ABD: soft, +bs EXT: no edema SKIN: no acute rash Cerumen R canal.  Normal L canal and TM.  No sx from R ear.

## 2022-06-09 NOTE — Assessment & Plan Note (Signed)
Tetanus 2013 Flu 2022 covid 2021 PNA not due. Shingles d/w pt.   Colonoscopy 06/25/17 with 10 year f/u.   Mammogram 2022 Living will d/w pt.  Husband designated if patient were incapacitated.  DXA- done prev per ortho.   Diet and exercise d/w pt.  She has been using a home exercise bike and strength training.  Diet is good.   [facility], [facility] since 2000.   Pap 2022

## 2022-06-09 NOTE — Assessment & Plan Note (Signed)
Living will d/w pt.  Husband designated if patient were incapacitated.  

## 2022-07-01 DIAGNOSIS — G43019 Migraine without aura, intractable, without status migrainosus: Secondary | ICD-10-CM | POA: Diagnosis not present

## 2022-07-01 DIAGNOSIS — G43719 Chronic migraine without aura, intractable, without status migrainosus: Secondary | ICD-10-CM | POA: Diagnosis not present

## 2022-07-01 DIAGNOSIS — M542 Cervicalgia: Secondary | ICD-10-CM | POA: Diagnosis not present

## 2022-07-25 DIAGNOSIS — G43019 Migraine without aura, intractable, without status migrainosus: Secondary | ICD-10-CM | POA: Diagnosis not present

## 2022-07-25 DIAGNOSIS — M542 Cervicalgia: Secondary | ICD-10-CM | POA: Diagnosis not present

## 2022-08-21 DIAGNOSIS — G43719 Chronic migraine without aura, intractable, without status migrainosus: Secondary | ICD-10-CM | POA: Diagnosis not present

## 2022-08-21 DIAGNOSIS — M542 Cervicalgia: Secondary | ICD-10-CM | POA: Diagnosis not present

## 2022-08-29 ENCOUNTER — Ambulatory Visit: Payer: BC Managed Care – PPO | Admitting: Family Medicine

## 2022-08-29 ENCOUNTER — Encounter: Payer: Self-pay | Admitting: Family Medicine

## 2022-08-29 ENCOUNTER — Ambulatory Visit (INDEPENDENT_AMBULATORY_CARE_PROVIDER_SITE_OTHER)
Admission: RE | Admit: 2022-08-29 | Discharge: 2022-08-29 | Disposition: A | Discharge status: Home / Self Care | Payer: BC Managed Care – PPO | Source: Ambulatory Visit | Attending: Family Medicine | Admitting: Family Medicine

## 2022-08-29 VITALS — BP 100/60 | HR 77 | Temp 97.6°F | Wt 150.1 lb

## 2022-08-29 DIAGNOSIS — M25531 Pain in right wrist: Secondary | ICD-10-CM | POA: Diagnosis not present

## 2022-08-29 NOTE — Patient Instructions (Signed)
Ice for 5 minutes at a time and try to get an over the counter wrist brace with a hard plate on the flexor side.  Update [place] as needed.  Take care.  Glad to see you.

## 2022-08-29 NOTE — Progress Notes (Unsigned)
Wrist pain.  Larey Seat about 3 weeks ago.  She isn't lightheaded now or at the time of the fall.  No LOC.  FOOSH on R hand.  Fell on L knee.  Bruised on R palm but that resolved.  Some days w/o pain but then some days with aching.  Not a sharp pain.  Not puffy and has ROM.  Ache on the dorsal R wrist, not on the flexor side.  Icing helps.  Hasn't used a brace that has a hard plate.    Meds, vitals, and allergies reviewed.   ROS: Per HPI unless specifically indicated in ROS section   Nad Ncat Normal range of motion right shoulder elbow wrist and hand.  She can make a fist.  She has some discomfort on the dorsal R wrist, not on the flexor side.  She is not specifically tender at the distal radius or ulna.  No bruising.  Distally neurovascular intact.  X-rays reviewed with patient at office visit.

## 2022-09-03 DIAGNOSIS — M25531 Pain in right wrist: Secondary | ICD-10-CM | POA: Insufficient documentation

## 2022-09-03 NOTE — Assessment & Plan Note (Signed)
Discussed options.  I still expect her to gradually improve. Ice for 5 minutes at a time and I asked her to try to get an over the counter wrist brace with a hard plate on the flexor side.  Use as needed, gradually wean use. Update [place] as needed.

## 2022-09-05 ENCOUNTER — Other Ambulatory Visit: Payer: Self-pay | Admitting: Family Medicine

## 2022-09-05 DIAGNOSIS — Z1231 Encounter for screening mammogram for malignant neoplasm of breast: Secondary | ICD-10-CM

## 2022-09-18 DIAGNOSIS — M542 Cervicalgia: Secondary | ICD-10-CM | POA: Diagnosis not present

## 2022-09-18 DIAGNOSIS — G43719 Chronic migraine without aura, intractable, without status migrainosus: Secondary | ICD-10-CM | POA: Diagnosis not present

## 2022-09-25 ENCOUNTER — Ambulatory Visit (INDEPENDENT_AMBULATORY_CARE_PROVIDER_SITE_OTHER): Payer: Self-pay

## 2022-09-25 DIAGNOSIS — Z23 Encounter for immunization: Secondary | ICD-10-CM

## 2022-10-07 ENCOUNTER — Ambulatory Visit
Admission: RE | Admit: 2022-10-07 | Discharge: 2022-10-07 | Disposition: A | Discharge status: Home / Self Care | Payer: BC Managed Care – PPO | Source: Ambulatory Visit | Attending: Family Medicine | Admitting: Family Medicine

## 2022-10-07 DIAGNOSIS — Z1231 Encounter for screening mammogram for malignant neoplasm of breast: Secondary | ICD-10-CM | POA: Diagnosis not present

## 2022-10-15 DIAGNOSIS — G43719 Chronic migraine without aura, intractable, without status migrainosus: Secondary | ICD-10-CM | POA: Diagnosis not present

## 2022-10-15 DIAGNOSIS — M542 Cervicalgia: Secondary | ICD-10-CM | POA: Diagnosis not present

## 2022-11-11 DIAGNOSIS — M542 Cervicalgia: Secondary | ICD-10-CM | POA: Diagnosis not present

## 2022-11-11 DIAGNOSIS — G43719 Chronic migraine without aura, intractable, without status migrainosus: Secondary | ICD-10-CM | POA: Diagnosis not present

## 2022-12-11 DIAGNOSIS — G43719 Chronic migraine without aura, intractable, without status migrainosus: Secondary | ICD-10-CM | POA: Diagnosis not present

## 2022-12-11 DIAGNOSIS — M542 Cervicalgia: Secondary | ICD-10-CM | POA: Diagnosis not present

## 2022-12-20 ENCOUNTER — Telehealth: Payer: Self-pay | Admitting: Family Medicine

## 2022-12-20 MED ORDER — DICLOFENAC POTASSIUM 50 MG PO TABS: 50.0000 mg | ORAL_TABLET | Freq: Three times a day (TID) | ORAL | 0 refills | Status: DC | PRN

## 2022-12-20 NOTE — Addendum Note (Signed)
Addended by: Tonia Ghent on: 12/20/2022 05:50 PM   Modules accepted: Orders

## 2022-12-20 NOTE — Telephone Encounter (Signed)
Patient would like to know if D[facility]ofinic could be prescribed to her by Dr Damita Dunnings?She stated that she usually gets this medication prescribed by the neurologist and they are closed today. The medication that she usually takes for her migraines she has already reached her limits of taking it twice in a row,her neurologist doesn't like for her to take it more than that in a row.  [phone]

## 2022-12-20 NOTE — Telephone Encounter (Signed)
Called and LMOVM.  Rx sent.  Please get update on patient on Monday.  Thanks.

## 2022-12-23 NOTE — Telephone Encounter (Signed)
Called patient, she was able to pick up the Rx Friday evening and it helped her a lot over the weekend. She was very appreciative for Dr. Damita Dunnings sending this in.

## 2022-12-24 NOTE — Telephone Encounter (Signed)
Noted. Thanks.

## 2023-01-08 DIAGNOSIS — M542 Cervicalgia: Secondary | ICD-10-CM | POA: Diagnosis not present

## 2023-01-08 DIAGNOSIS — G43719 Chronic migraine without aura, intractable, without status migrainosus: Secondary | ICD-10-CM | POA: Diagnosis not present

## 2023-01-24 ENCOUNTER — Encounter: Payer: Self-pay | Admitting: Family Medicine

## 2023-01-24 ENCOUNTER — Ambulatory Visit: Payer: BC Managed Care – PPO | Admitting: Family Medicine

## 2023-01-24 VITALS — BP 102/80 | HR 95 | Temp 97.8°F | Ht 61.5 in | Wt 151.0 lb

## 2023-01-24 DIAGNOSIS — G43009 Migraine without aura, not intractable, without status migrainosus: Secondary | ICD-10-CM | POA: Diagnosis not present

## 2023-01-24 NOTE — Patient Instructions (Addendum)
If you keep having spotting after your next period or if you don't have a period then let me know.    Take care.  Glad to see you.

## 2023-01-24 NOTE — Progress Notes (Unsigned)
D/w pt about options at this point.  Still on OCPs.  At this point she was concerned about perimenopausal changes.  Had episodic spotting and then having HA. Still on baseline meds.  Last regular period was August.  Plan (based on OCP use) for next period this month.  Spotting started about 2 weeks ago.  Mild bleeding with spotting but it was atypical.  Usually with period every 6 months based on med use- and that had usually helped control her headaches.  No spotting since the weekend.   Meds, vitals, and allergies reviewed.   ROS: Per HPI unless specifically indicated in ROS section   GEN: nad, alert and oriented HEENT: ncat NECK: supple w/o LA CV: rrr.  PULM: ctab, no inc wob ABD: soft, +bs, not ttp EXT: no edema SKIN: well perfused.

## 2023-01-26 NOTE — Assessment & Plan Note (Addendum)
Discussed options.  I think it makes sense to see how she is feeling after she has her next period.  If she keeps having spotting or if she does not have a period then I want her to let me know.  If she does not have a period or if she continues to have spotting then options going forward would include tapering her OCP strength vs q planning for q4 month menses vs stopping OCPs and checking FSH after 1 month.

## 2023-01-27 ENCOUNTER — Encounter: Payer: Self-pay | Admitting: Family Medicine

## 2023-01-29 ENCOUNTER — Other Ambulatory Visit: Payer: Self-pay | Admitting: Family Medicine

## 2023-01-29 MED ORDER — DICLOFENAC POTASSIUM 50 MG PO TABS: 50.0000 mg | ORAL_TABLET | Freq: Three times a day (TID) | ORAL | 1 refills | Status: DC | PRN

## 2023-02-04 DIAGNOSIS — G43719 Chronic migraine without aura, intractable, without status migrainosus: Secondary | ICD-10-CM | POA: Diagnosis not present

## 2023-02-04 DIAGNOSIS — M542 Cervicalgia: Secondary | ICD-10-CM | POA: Diagnosis not present

## 2023-03-04 DIAGNOSIS — G43719 Chronic migraine without aura, intractable, without status migrainosus: Secondary | ICD-10-CM | POA: Diagnosis not present

## 2023-03-04 DIAGNOSIS — M542 Cervicalgia: Secondary | ICD-10-CM | POA: Diagnosis not present

## 2023-03-19 ENCOUNTER — Other Ambulatory Visit: Payer: Self-pay | Admitting: Family Medicine

## 2023-03-27 DIAGNOSIS — G43719 Chronic migraine without aura, intractable, without status migrainosus: Secondary | ICD-10-CM | POA: Diagnosis not present

## 2023-03-27 DIAGNOSIS — G43019 Migraine without aura, intractable, without status migrainosus: Secondary | ICD-10-CM | POA: Diagnosis not present

## 2023-03-27 DIAGNOSIS — M542 Cervicalgia: Secondary | ICD-10-CM | POA: Diagnosis not present

## 2023-04-23 DIAGNOSIS — M542 Cervicalgia: Secondary | ICD-10-CM | POA: Diagnosis not present

## 2023-04-23 DIAGNOSIS — G43719 Chronic migraine without aura, intractable, without status migrainosus: Secondary | ICD-10-CM | POA: Diagnosis not present

## 2023-05-13 ENCOUNTER — Encounter: Payer: Self-pay | Admitting: Family Medicine

## 2023-05-13 ENCOUNTER — Ambulatory Visit: Payer: BC Managed Care – PPO | Admitting: Family Medicine

## 2023-05-13 VITALS — BP 120/76 | HR 86 | Temp 98.0°F | Ht 61.5 in | Wt 152.0 lb

## 2023-05-13 DIAGNOSIS — R0789 Other chest pain: Secondary | ICD-10-CM | POA: Diagnosis not present

## 2023-05-13 MED ORDER — DICLOFENAC POTASSIUM 50 MG PO TABS: 50.0000 mg | ORAL_TABLET | Freq: Three times a day (TID) | ORAL | 1 refills | Status: DC | PRN

## 2023-05-13 NOTE — Patient Instructions (Addendum)
Likely chest wall pain.  I would take diclofenac and see if that helps.  Update [place] as needed.   Take care.  Glad to see you. Update me if not getting better.

## 2023-05-13 NOTE — Progress Notes (Unsigned)
Used cataflam prn.  Has changed back to emgallity per HA clinic.    Chest wall pain.  At the R lower ribs.  Noted with cough, sneeze.  Pain with deep breath. Woke up with pain last night.  Unclear if she pulled a muscle with exercise.  Feels well o/w.  Less pain upright and when walking around.  Pain is always in the same spot but not always constant.  No FCNAVD.  No sputum.  She doesn't feel unwell.  H/o cholecystectomy.  No falls. Rare dry cough o/w.  She tried ibuprofen in the meantime w/o much relief.    Meds, vitals, and allergies reviewed.   ROS: Per HPI unless specifically indicated in ROS section   GEN: nad, alert and oriented HEENT: ncat NECK: supple w/o LA CV: rrr.   PULM: ctab, no inc wob, focally tender to palpation on the right anterior lower ribs near the midclavicular line.  Right upper quadrant not tender. ABD: soft, +bs EXT: no edema SKIN: No bruising.  No rash.

## 2023-05-14 DIAGNOSIS — R0789 Other chest pain: Secondary | ICD-10-CM | POA: Insufficient documentation

## 2023-05-14 NOTE — Assessment & Plan Note (Signed)
Likely chest wall pain.  Less likely to be pleurisy.  I would take diclofenac and see if that helps.  Update me if not getting better.  No reason to suspect an ominous cause at this point.  Discussed with patient.

## 2023-05-20 DIAGNOSIS — M542 Cervicalgia: Secondary | ICD-10-CM | POA: Diagnosis not present

## 2023-05-20 DIAGNOSIS — G43719 Chronic migraine without aura, intractable, without status migrainosus: Secondary | ICD-10-CM | POA: Diagnosis not present

## 2023-05-30 ENCOUNTER — Other Ambulatory Visit: Payer: Self-pay | Admitting: Family Medicine

## 2023-05-30 DIAGNOSIS — R7989 Other specified abnormal findings of blood chemistry: Secondary | ICD-10-CM

## 2023-06-02 ENCOUNTER — Other Ambulatory Visit (INDEPENDENT_AMBULATORY_CARE_PROVIDER_SITE_OTHER): Payer: BC Managed Care – PPO

## 2023-06-02 DIAGNOSIS — R7989 Other specified abnormal findings of blood chemistry: Secondary | ICD-10-CM

## 2023-06-02 LAB — LIPID PANEL
Cholesterol: 214 mg/dL — ABNORMAL HIGH (ref 0–200)
HDL: 89 mg/dL (ref >39.00)
LDL Cholesterol: 99 mg/dL (ref 0–99)
NonHDL: 124.54
Total CHOL/HDL Ratio: 2
Triglycerides: 127 mg/dL (ref 0.0–149.0)
VLDL: 25.4 mg/dL (ref 0.0–40.0)

## 2023-06-02 LAB — COMPREHENSIVE METABOLIC PANEL
ALT: 15 U/L (ref 0–35)
AST: 18 U/L (ref 0–37)
Albumin: 4.3 g/dL (ref 3.5–5.2)
Alkaline Phosphatase: 34 U/L — ABNORMAL LOW (ref 39–117)
BUN: 15 mg/dL (ref 6–23)
CO2: 25 mEq/L (ref 19–32)
Calcium: 9.8 mg/dL (ref 8.4–10.5)
Chloride: 98 mEq/L (ref 96–112)
Creatinine, Ser: 0.84 mg/dL (ref 0.40–1.20)
GFR: 77.63 mL/min (ref >60.00)
Glucose, Bld: 94 mg/dL (ref 70–99)
Potassium: 5.1 mEq/L (ref 3.5–5.1)
Sodium: 133 mEq/L — ABNORMAL LOW (ref 135–145)
Total Bilirubin: 0.6 mg/dL (ref 0.2–1.2)
Total Protein: 7.2 g/dL (ref 6.0–8.3)

## 2023-06-09 ENCOUNTER — Ambulatory Visit (INDEPENDENT_AMBULATORY_CARE_PROVIDER_SITE_OTHER): Payer: BC Managed Care – PPO | Admitting: Family Medicine

## 2023-06-09 ENCOUNTER — Encounter: Payer: Self-pay | Admitting: Family Medicine

## 2023-06-09 VITALS — BP 110/68 | HR 51 | Temp 97.3°F | Ht 61.5 in | Wt 151.0 lb

## 2023-06-09 DIAGNOSIS — G43009 Migraine without aura, not intractable, without status migrainosus: Secondary | ICD-10-CM

## 2023-06-09 DIAGNOSIS — R42 Dizziness and giddiness: Secondary | ICD-10-CM

## 2023-06-09 DIAGNOSIS — Z0000 Encounter for general adult medical examination without abnormal findings: Secondary | ICD-10-CM

## 2023-06-09 DIAGNOSIS — Z7189 Other specified counseling: Secondary | ICD-10-CM

## 2023-06-09 MED ORDER — DESOGESTREL-ETHINYL ESTRADIOL 0.15-30 MG-MCG PO TABS: 1.0000 | ORAL_TABLET | Freq: Every day | ORAL | 3 refills | Status: DC

## 2023-06-09 NOTE — Progress Notes (Unsigned)
CPE- See plan.  Routine anticipatory guidance given to patient.  See health maintenance.  The possibility exists that previously documented standard health maintenance information may have been brought forward from a previous encounter into this note.  If needed, that same information has been updated to reflect the current situation based on today's encounter.    Tetanus 2023 Flu prev done.   covid 2021 PNA not due. Shingles d/w pt.   Colonoscopy 06/25/17 with 10 year f/u.   Mammogram 2023 Living will d/w pt.  Husband designated if patient were incapacitated.  DXA- done prev per ortho.   Diet and exercise d/w pt.  She has been using a home exercise bike and strength training.  Diet is good.   [facility] since [facility].   Pap 2022  Chest wall pain got better and then resolved.    She has h/o brief vertigo sx, longstanding. Quick onset.  Then they resolve.  May not be as often as monthly.  No sx now.  I asked her to check with neurology about this.  Unclear if this is migraine related.  Migranes per outside clinic.  She is seeing HA clinic next month. She is better on emgality- frequency improved after 3 months of tx.  Seeing Dr. Neale Burly.    We talked about the fact that she could be in or near menopause.  Discussed that I would like to consider options in the meantime and we will update patient.  PMH and SH reviewed  Meds, vitals, and allergies reviewed.   ROS: Per HPI.  Unless specifically indicated otherwise in HPI, the patient denies:  General: fever. Eyes: acute vision changes ENT: sore throat Cardiovascular: chest pain Respiratory: SOB GI: vomiting GU: dysuria Musculoskeletal: acute back pain Derm: acute rash Neuro: acute motor dysfunction Psych: worsening mood Endocrine: polydipsia Heme: bleeding Allergy: hayfever  GEN: nad, alert and oriented HEENT: mucous membranes moist NECK: supple w/o LA CV: rrr. PULM: ctab, no inc  wob ABD: soft, +bs EXT: no edema SKIN: Well-perfused  The 10-year ASCVD risk score (Arnett DK, et al., 2019) is: 1.1%   Values used to calculate the score:     Age: 57 years     Sex: Female     Is Non-Hispanic African American: No     Diabetic: No     Tobacco smoker: No     Systolic Blood Pressure: 110 mmHg     Is BP treated: No     HDL Cholesterol: 89 mg/dL     Total Cholesterol: 214 mg/dL  Labs and ASCVD score discussed with patient.

## 2023-06-09 NOTE — Patient Instructions (Addendum)
Check with your insurance to see if they will cover the shingles shot. I'll check on options and we'll be in touch.  Take care.  Glad to see you.

## 2023-06-11 DIAGNOSIS — R42 Dizziness and giddiness: Secondary | ICD-10-CM | POA: Insufficient documentation

## 2023-06-11 NOTE — Assessment & Plan Note (Signed)
Living will d/w pt.  Husband designated if patient were incapacitated.  

## 2023-06-11 NOTE — Assessment & Plan Note (Addendum)
Migranes per outside clinic.  She is seeing HA clinic next month. She is better on emgality- frequency improved after 3 months of tx.  Seeing Dr. Neale Burly.  Discussed that I would like to consider options about tapering her birth control pills in the meantime and we will update patient.

## 2023-06-11 NOTE — Assessment & Plan Note (Signed)
She has h/o brief vertigo sx, longstanding. Quick onset.  Then they resolve.  May not be as often as monthly.  No sx now.  I asked her to check with neurology about this.  Unclear if this is migraine related.  I also asked her to monitor her symptoms, to see if she has rotational symptoms that occur in the same direction/plane or variable direction/planes.

## 2023-06-11 NOTE — Assessment & Plan Note (Signed)
Tetanus 2023 Flu prev done.   covid 2021 PNA not due. Shingles d/w pt.   Colonoscopy 06/25/17 with 10 year f/u.   Mammogram 2023 Living will d/w pt.  Husband designated if patient were incapacitated.  DXA- done prev per ortho.   Diet and exercise d/w pt.  She has been using a home exercise bike and strength training.  Diet is good.   [facility], [facility] since [facility].   Pap 2022

## 2023-06-12 ENCOUNTER — Encounter: Payer: Self-pay | Admitting: Family Medicine

## 2023-06-15 ENCOUNTER — Telehealth: Payer: Self-pay | Admitting: Family Medicine

## 2023-06-15 MED ORDER — LEVONORGESTREL-ETHINYL ESTRAD 0.1-20 MG-MCG PO TABS: 1.0000 | ORAL_TABLET | Freq: Every day | ORAL | 3 refills | Status: DC

## 2023-06-15 NOTE — Telephone Encounter (Signed)
Please update patient.  Aviane is an OCP with lower hormone dose.  Would try that the next time she finishes a pack.  Please update me about 1 month after the change.  We can make plans at that point.    Please send a copy of this note to Dr. Neale Burly with the headache clinic.    Thanks.

## 2023-06-16 NOTE — Telephone Encounter (Signed)
Note has been routed to Dr. Neale Burly.

## 2023-06-16 NOTE — Telephone Encounter (Signed)
Patient notified of new BC rx; and advised to call back with update in 1 month after starting rx.

## 2023-06-17 DIAGNOSIS — G43719 Chronic migraine without aura, intractable, without status migrainosus: Secondary | ICD-10-CM | POA: Diagnosis not present

## 2023-06-17 DIAGNOSIS — M542 Cervicalgia: Secondary | ICD-10-CM | POA: Diagnosis not present

## 2023-07-06 NOTE — Telephone Encounter (Signed)
Will await update from patient.  If she does well on this birth control pill (Aviane), then it may sense to keep her on this dose for 2-3 months and if stable at that point, try discontinuation.

## 2023-07-16 DIAGNOSIS — G43719 Chronic migraine without aura, intractable, without status migrainosus: Secondary | ICD-10-CM | POA: Diagnosis not present

## 2023-07-16 DIAGNOSIS — M542 Cervicalgia: Secondary | ICD-10-CM | POA: Diagnosis not present

## 2023-07-25 ENCOUNTER — Encounter: Payer: Self-pay | Admitting: Family Medicine

## 2023-07-25 ENCOUNTER — Ambulatory Visit: Payer: BC Managed Care – PPO | Admitting: Family Medicine

## 2023-07-25 VITALS — BP 110/72 | HR 92 | Temp 98.1°F | Ht 61.5 in | Wt 155.0 lb

## 2023-07-25 DIAGNOSIS — M25519 Pain in unspecified shoulder: Secondary | ICD-10-CM | POA: Diagnosis not present

## 2023-07-25 DIAGNOSIS — G43009 Migraine without aura, not intractable, without status migrainosus: Secondary | ICD-10-CM | POA: Diagnosis not present

## 2023-07-25 DIAGNOSIS — R202 Paresthesia of skin: Secondary | ICD-10-CM | POA: Diagnosis not present

## 2023-07-25 NOTE — Progress Notes (Unsigned)
Migraines d/w pt.  Changed to new OCPs.  Had a period.  Had inc HA after the change.  The next week she had a few good days then more HA.  Then HA last 3 days.  No HA today.  She is going to start her new month of pills soon.  We agreed if she can tolerate to continue as is.  If not better in the next month then we may need to change to prior rx.    There was also sig weather changes in the last month, unclear if that contributed.    Left shoulder pain x couple months. Patient has no known injury or strain. Numbness intermittently in the L 1st and 2nd fingers.  Not numb down the arm.  Pain sleeping on the L side. No R sided sx.  No L foot sx.

## 2023-07-25 NOTE — Patient Instructions (Addendum)
Update me in 1 month, sooner if needed.   Try a wrist brace in the meantime.   Let me know if you need a different chair at work.   You should get a call about PT.  Take care.  Glad to see you.

## 2023-07-27 DIAGNOSIS — R202 Paresthesia of skin: Secondary | ICD-10-CM | POA: Insufficient documentation

## 2023-07-27 NOTE — Assessment & Plan Note (Signed)
Would continue with current birth control pills for 1 more month and let me know if she continues to have increased migraine frequency.  Unclear if recent weather changes contributed to recent spike in migraine frequency.

## 2023-07-27 NOTE — Assessment & Plan Note (Signed)
Bilateral, left greater than right, without foot symptoms.  Reproducible with Tinel testing.  Concern for carpal tunnel symptoms.  Discussed using a wrist brace as much as possible and also seeing if she can change chairs at work so that she will be in a more comfortable position.  She can update me as needed.

## 2023-07-27 NOTE — Assessment & Plan Note (Signed)
Concern for rotator cuff tendinitis, discussed with patient about options and reasonable to see physical therapy.  Referral placed.

## 2023-07-31 ENCOUNTER — Other Ambulatory Visit: Payer: Self-pay

## 2023-07-31 ENCOUNTER — Ambulatory Visit: Payer: BC Managed Care – PPO | Attending: Family Medicine

## 2023-07-31 DIAGNOSIS — M6281 Muscle weakness (generalized): Secondary | ICD-10-CM | POA: Insufficient documentation

## 2023-07-31 DIAGNOSIS — G8929 Other chronic pain: Secondary | ICD-10-CM | POA: Diagnosis not present

## 2023-07-31 DIAGNOSIS — M25512 Pain in left shoulder: Secondary | ICD-10-CM | POA: Diagnosis not present

## 2023-07-31 DIAGNOSIS — M25519 Pain in unspecified shoulder: Secondary | ICD-10-CM | POA: Diagnosis not present

## 2023-07-31 NOTE — Progress Notes (Addendum)
OUTPATIENT PHYSICAL THERAPY SHOULDER EVALUATION   Patient Name: Margaret Brewer MRN: [id number] DOB:06/26/1966, 57 y.o., female Today's Date: 08/01/2023  END OF SESSION:  PT End of Session - 07/31/23 1242     Visit Number 1    Number of Visits 13    Date for PT Re-Evaluation 09/11/23    PT Start Time 0815    PT Stop Time 0857    PT Time Calculation (min) 42 min    Activity Tolerance Patient tolerated treatment well    Behavior During Therapy [facility] for tasks assessed/performed             Past Medical History:  Diagnosis Date   Hyperlipidemia    with high HDL   Metatarsal fracture    Migraine without aura    varies with hormonal changes and weather changes   Past Surgical History:  Procedure Laterality Date   CHOLECYSTECTOMY  01/14/2002   Dr. Evette Cristal   ESOPHAGOGASTRODUODENOSCOPY (EGD) WITH PROPOFOL N/A 07/07/2015   Procedure: ESOPHAGOGASTRODUODENOSCOPY (EGD) WITH PROPOFOL;  Surgeon: Christena Deem, MD;  Location: [facility] ENDOSCOPY;  Service: Endoscopy;  Laterality: N/A;   OOPHORECTOMY  04/1986   Ovarian cyst, unilateral oophorectomy   TONSILLECTOMY     As a child   Patient Active Problem List   Diagnosis Date Noted   Hand paresthesia 07/27/2023   Vertigo 06/11/2023   Chest wall pain 05/14/2023   Right wrist pain 09/03/2022   Left foot pain 07/16/2020   Advance care planning 03/09/2015   Routine general medical examination at a health care facility 02/13/2012   Shoulder pain 02/13/2012   Migraine without aura 12/29/2007    PCP: Joaquim Nam, MD  REFERRING PROVIDER: Joaquim Nam, MD  REFERRING DIAG: M25.519 (ICD-10-CM) - Shoulder pain, unspecified chronicity, unspecified laterality  THERAPY DIAG:  Chronic left shoulder pain  Muscle weakness (generalized)  Rationale for Evaluation and Treatment: Rehabilitation  ONSET DATE: Multiple years, but most recent flare up 3 months ago   SUBJECTIVE:                                                                                                                                                                                       SUBJECTIVE STATEMENT:  Pt reports chronic L shoulder pain with a recent flare up 3 months ago. Pt reports insidious onset for the most recent flare up, with intermittent pain down anterior upper arm. Pt also reports being dx with carpal tunnel syndrome in bilat hands L>R and has been wearing a brace on her L wrist since last Friday.   Hand dominance: Right  PERTINENT HISTORY: Pt is a 57 y/o F w/  chronic L shoulder pain for many years. Pt's most recent flare up occurred with insidious onset when she was sitting at her desk at work and felt a sharp, shooting pain down the front of her Lt shoulder. Pt reports difficulty with driving >69 minutes, overhead movements and ADL's such as putting on her bra and lifting activities. Pt's sx are relieved by ceasing movement, and icing her L shoulder. Pt also reports having intense migraines that are currently being managed by chiropractic services. Pt has been dx with carpal tunnel in bilat hands L >R as well, and has been wearing a brace to treat this condition. She addresses concerns of pain in the anterior shoulder near the biceps tendon, and clicking and popping in the L shoulder with movement. Along with, difficulty sleeping on her left side.  However, there are no concerns expressed of N/T down the L arm.   PAIN:  Are you having pain? Yes: NPRS scale: 2/10 Pain location: Back of the L shoulder, into the front of the L shoulder Pain description: Dull and achy,  Aggravating factors: Overhead movements, lifting activities Relieving factors: Ceasing movements, icing, chiropractor  Least pain: 2/10 sitting, Worst pain: 4/10 with activity  No significant change in physical activity to contribute to recent flare up.    PRECAUTIONS: None  RED FLAGS: None   WEIGHT BEARING RESTRICTIONS: No  FALLS:  Has patient fallen in last 6  months? 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   UPPER EXTREMITY ROM: All AROM grossly WFL, mild deficits w/ L shoulder IR AROM.   UPPER EXTREMITY MMT:  MMT Right eval Left eval  Shoulder flexion 4+ 4  Shoulder extension 4+ 4-  Shoulder abduction 4+ 4-  Shoulder adduction    Shoulder internal rotation 4+ 4-  Shoulder external rotation 4+ 4  Middle trapezius 4- 3+  Lower trapezius 4- 3+  Elbow flexion 4+ 4+  Elbow extension    Wrist flexion    Wrist extension    Wrist ulnar deviation    Wrist radial deviation    Wrist pronation    Wrist supination    Grip strength (lbs) 5 4+  (Blank rows = not tested)  CERVICAL SPECIAL TESTS: Spurling's: Negative   SHOULDER SPECIAL TESTS: Impingement tests: Hawkins/Kennedy impingement test: positive  Rotator cuff assessment: Drop arm test: negative, Empty can test: negative, and Full can test: positive   JOINT MOBILITY TESTING:  No notable hypomobility noted in L shoulder w/ A<>P and P <> A joint mobilizations.   PALPATION:  To be assessed at next visit.    TODAY'S TREATMENT:  DATE: 07/31/23  Reviewed provided HEP (reps/sets/frequency)  PATIENT EDUCATION: Education details: HEP given to pt  Person educated: Patient Education method: Medical illustrator Education comprehension: verbalized understanding and returned demonstration  HOME EXERCISE PROGRAM: Access Code: [email or web address] URL: [email or web address] Date: 08/01/2023 Prepared by: Ronnie Derby  Exercises - Standing Isometric Shoulder Internal Rotation at Doorway  - 1 x daily - 7 x weekly - 10 sets - 10 hold - Seated Scapular Retraction  - 1 x daily - 7 x weekly - 2 sets - 10 reps - Prone Shoulder Horizontal Abduction  - 1 x daily - 7 x weekly - 2 sets - 8 reps - Prone Single Arm Shoulder Y  - 1 x daily - 7 x weekly - 2 sets - 8 reps  ASSESSMENT:  CLINICAL IMPRESSION: Patient is a 57 y.o. F who was seen today for physical therapy evaluation and treatment for chronic L shoulder pain. Pt has been experiencing shoulder pain for years, however this most recent flare up was insidious. Pt presents with 2-4/10 NPS, with sx increasing with driving >45 min, overhead and lifting activities. Upon examination, mild AROM deficits were noted with the most limited ROM deficit being in L shoulder IR w/ concordant pain. Pt also notes strength deficits in L shoulder IR, abduction, middle and lower trapezius displayed with MMT and decreased ability to perform functional movements 2/2 weakness. Pt does not show any sx of N/T down LUE, confirmed with a negative Spurling's assessment. Pt's sx are consistent with being musculature in nature, as no joint hypomobility was observed during joint mobility assessment. These symptoms are limiting pt in dressing ADL's and return to pain free recreational activity. Pt would benefit from skilled PT to address strength and pain deficits to improve QoL and return to PLOF.   OBJECTIVE IMPAIRMENTS: decreased ROM, decreased strength, and impaired UE functional use.   ACTIVITY LIMITATIONS: carrying, lifting, and reach over head  PARTICIPATION LIMITATIONS: cleaning, driving, and community activity  PERSONAL FACTORS: Age, Fitness, and Time since onset of injury/illness/exacerbation are also affecting patient's functional outcome.   REHAB POTENTIAL: Good  CLINICAL DECISION MAKING: Stable/uncomplicated  EVALUATION COMPLEXITY: Low   GOALS: Goals reviewed with  patient? Yes  SHORT TERM GOALS: Target date: 08/21/23  Pt will be independent with HEP to improve L shoulder strength and decrease pain with functional activities  Baseline: 07/31/23: HEP given to pt Goal status: INITIAL  LONG TERM GOALS: Target date: 09/11/23  Pt will improve FOTO to target score to demonstrate clinically significant improvement in functional mobility.  Baseline: 07/31/23: 69/72 Goal status: INITIAL  2.  Pt will decrease pain to <2/10 to demonstrate clinically significant improvement in pain levels with functional activity and ADL's.  Baseline: 07/31/23: 4/10 Goal status: INITIAL  3.  Pt will self report the ability to utilize her LUE to drive >40 min, w/ <9/81 pain, to demonstrate improvements with ability to complete work related activities.  Baseline: 07/31/23: Cannot drive w/ LUE for >19 minutes w/o 4/10 pain.  Goal status: INITIAL  PLAN:  PT FREQUENCY: 2x/week  PT DURATION: 6 weeks  PLANNED INTERVENTIONS: Therapeutic exercises, Therapeutic activity, Neuromuscular re-education, Balance training, Gait training, Patient/Family education, Self Care, Joint mobilization, Joint manipulation, Dry Needling, Electrical stimulation, Spinal manipulation, Spinal mobilization, Cryotherapy, Moist heat, Traction, and Manual therapy  PLAN FOR NEXT SESSION: Review HEP, Progress L shoulder RTC and peri-scapular strengthening exercises.   Lovie Macadamia, SPT  Delphia Grates. Fairly IV, PT, DPT Physical Therapist- [place]  [facility]  08/01/2023, 10:57 AM

## 2023-08-01 NOTE — Progress Notes (Signed)
  Physician Documentation  Your signature is required to indicate approval of the treatment plan as stated above. By signing this report, you are approving the plan of care. Please sign and either send electronically or print and fax the signed copy to the number below. If you approve with modifications, please indicate those in the space provided.  Physician Signature:  Crawford Givens  Date: 08/01/23 Time:11:04 AM

## 2023-08-01 NOTE — Addendum Note (Signed)
Addended by: Georgia Duff IV on: 08/01/2023 11:00 AM   Modules accepted: Orders

## 2023-08-04 ENCOUNTER — Ambulatory Visit: Payer: BC Managed Care – PPO

## 2023-08-04 DIAGNOSIS — G8929 Other chronic pain: Secondary | ICD-10-CM | POA: Diagnosis not present

## 2023-08-04 DIAGNOSIS — M6281 Muscle weakness (generalized): Secondary | ICD-10-CM

## 2023-08-04 DIAGNOSIS — M25519 Pain in unspecified shoulder: Secondary | ICD-10-CM | POA: Diagnosis not present

## 2023-08-04 DIAGNOSIS — M25512 Pain in left shoulder: Secondary | ICD-10-CM | POA: Diagnosis not present

## 2023-08-04 NOTE — Progress Notes (Addendum)
OUTPATIENT PHYSICAL THERAPY SHOULDER TREATMENT   Patient Name: Margaret Brewer MRN: [id number] DOB:January 06, 1966, 57 y.o., female Today's Date: 08/04/2023  END OF SESSION:  PT End of Session - 08/04/23 1011     Visit Number 2    Number of Visits 13    Date for PT Re-Evaluation 09/11/23    PT Start Time 0816    PT Stop Time 0854    PT Time Calculation (min) 38 min    Activity Tolerance Patient tolerated treatment well    Behavior During Therapy [facility] for tasks assessed/performed              Past Medical History:  Diagnosis Date   Hyperlipidemia    with high HDL   Metatarsal fracture    Migraine without aura    varies with hormonal changes and weather changes   Past Surgical History:  Procedure Laterality Date   CHOLECYSTECTOMY  01/14/2002   Dr. Evette Cristal   ESOPHAGOGASTRODUODENOSCOPY (EGD) WITH PROPOFOL N/A 07/07/2015   Procedure: ESOPHAGOGASTRODUODENOSCOPY (EGD) WITH PROPOFOL;  Surgeon: Christena Deem, MD;  Location[facility] ENDOSCOPY;  Service: Endoscopy;  Laterality: N/A;   OOPHORECTOMY  04/1986   Ovarian cyst, unilateral oophorectomy   TONSILLECTOMY     As a child   Patient Active Problem List   Diagnosis Date Noted   Hand paresthesia 07/27/2023   Vertigo 06/11/2023   Chest wall pain 05/14/2023   Right wrist pain 09/03/2022   Left foot pain 07/16/2020   Advance care planning 03/09/2015   Routine general medical examination at a health care facility 02/13/2012   Shoulder pain 02/13/2012   Migraine without aura 12/29/2007    PCP: Joaquim Nam, MD  REFERRING PROVIDER: Joaquim Nam, MD  REFERRING DIAG: M25.519 (ICD-10-CM) - Shoulder pain, unspecified chronicity, unspecified laterality  THERAPY DIAG:  Chronic left shoulder pain  Muscle weakness (generalized)  Rationale for Evaluation and Treatment: Rehabilitation  ONSET DATE: Multiple years, but most recent flare up 3 months ago   SUBJECTIVE:                                                                                                                                                                                       SUBJECTIVE STATEMENT:  Pt reports pain in L shoulder at a 2/10 today. Pt expresses difficulty with overhead activities such as putting a casserole in the higher part of the oven. Pt addresses concerns of intermittent sharp, shooting pain at the front of the shoulder while at work on Friday.  Hand dominance: Right  PERTINENT HISTORY: Pt is a 57 y/o F w/ chronic L shoulder pain for many years. Pt's most recent  flare up occurred with insidious onset when she was sitting at her desk at work and felt a sharp, shooting pain down the front of her Lt shoulder. Pt reports difficulty with driving >95 minutes, overhead movements and ADL's such as putting on her bra and lifting activities. Pt's sx are relieved by ceasing movement, and icing her L shoulder. Pt also reports having intense migraines that are currently being managed by chiropractic services. Pt has been dx with carpal tunnel in bilat hands L >R as well, and has been wearing a brace to treat this condition. She addresses concerns of pain in the anterior shoulder near the biceps tendon, and clicking and popping in the L shoulder with movement. Along with, difficulty sleeping on her left side.  However, there are no concerns expressed of N/T down the L arm.   PAIN:  Are you having pain? Yes: NPRS scale: 2/10 Pain location: Back of the L shoulder, into the front of the L shoulder Pain description: Dull and achy,  Aggravating factors: Overhead movements, lifting activities Relieving factors: Ceasing movements, icing, chiropractor  Least pain: 2/10 sitting, Worst pain: 4/10 with activity  No significant change in physical activity to contribute to recent flare up.    PRECAUTIONS: None  RED FLAGS: None   WEIGHT BEARING RESTRICTIONS: No  FALLS:  Has patient fallen in last 6 months? 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   UPPER EXTREMITY ROM: All AROM grossly WFL, mild deficits w/ L shoulder IR AROM.   UPPER EXTREMITY MMT:  MMT Right eval Left eval  Shoulder flexion 4+ 4  Shoulder extension 4+ 4-  Shoulder abduction 4+ 4-  Shoulder adduction    Shoulder internal rotation 4+ 4-  Shoulder external rotation 4+ 4  Middle trapezius 4- 3+  Lower trapezius 4- 3+  Elbow flexion 4+ 4+  Elbow extension    Wrist flexion    Wrist extension    Wrist ulnar deviation    Wrist radial deviation    Wrist pronation    Wrist supination    Grip strength (lbs) 5 4+  (Blank rows = not tested)  CERVICAL SPECIAL TESTS: Spurling's: Negative   SHOULDER SPECIAL TESTS: Impingement tests: Hawkins/Kennedy impingement test: positive  Rotator cuff assessment: Drop arm test: negative, Empty can test: negative, and Full can test: positive   JOINT MOBILITY TESTING:  No notable hypomobility noted in L shoulder w/ A<>P and P <> A joint mobilizations.   PALPATION:  To be assessed at next visit.    TODAY'S TREATMENT:  DATE: 08/04/23  Reviewed provided HEP (reps/sets/frequency) Exercises - Standing Isometric Shoulder Internal Rotation at Doorway  - 1 x daily - 7 x weekly - 10 sets - 10 hold - Seated Scapular Retraction  - 1 x daily - 7 x weekly - 2 sets - 10 reps (added Black TB for additional resistance)  - Prone Shoulder Horizontal Abduction  - 1 x daily - 7 x weekly - 2 sets - 8 reps (advised pt to maintain below 90 deg of abduction for reduced pain, and to maintain  neutral rotation)   - Prone Single Arm Shoulder Y  - 1 x daily - 7 x weekly - 2 sets - 8 reps  Seated thoracic ext w/ half bolster in chair 2 x12  Standing shoulder flexion+ isometric ER at wall w/ RTB (proximal to wrists) 3 x12   Standing shoulder ext w/ black TB 2 x12  Seated shoulder rows w/ Black TB 2 x12    PATIENT EDUCATION: Education details: HEP given to pt  Person educated: Patient Education method: Medical illustrator Education comprehension: verbalized understanding and returned demonstration  HOME EXERCISE PROGRAM: Access Code: [email or web address] URL: [email or web address] Date: 08/01/2023 Prepared by: Ronnie Derby  Exercises - Standing Isometric Shoulder Internal Rotation at Doorway  - 1 x daily - 7 x weekly - 10 sets - 10 hold - Seated Scapular Retraction  - 1 x daily - 7 x weekly - 2 sets - 10 reps - Prone Shoulder Horizontal Abduction  - 1 x daily - 7 x weekly - 2 sets - 8 reps - Prone Single Arm Shoulder Y  - 1 x daily - 7 x weekly - 2 sets - 8 reps  ASSESSMENT:  CLINICAL IMPRESSION: Session focused on reviewing pt's HEP and progressing L shoulder and bilat peri-scapular strengthening exercises. Pt's HEP was modified to relieve pain as well as progress exercises in plan to her tolerance. Pt displays improvements in L shoulder strength and decreased pain noted with improved activity tolerance during today's exercises. Pt continues to note difficulty performing overhead activities, noted with pt's self- reported increase in pain with ADL's and functional movements. Future sessions will focus on compound overhead movements to strengthen her L shoulder in that plane of motion. Pt would benefit from skilled PT interventions to address strength and pain deficits to improve QoL and return to her PLOF.   OBJECTIVE IMPAIRMENTS: decreased ROM, decreased strength, and impaired UE functional use.   ACTIVITY LIMITATIONS: carrying, lifting, and reach over  head  PARTICIPATION LIMITATIONS: cleaning, driving, and community activity  PERSONAL FACTORS: Age, Fitness, and Time since onset of injury/illness/exacerbation are also affecting patient's functional outcome.   REHAB POTENTIAL: Good  CLINICAL DECISION MAKING: Stable/uncomplicated  EVALUATION COMPLEXITY: Low   GOALS: Goals reviewed with patient? Yes  SHORT TERM GOALS: Target date: 08/21/23  Pt will be independent with HEP to improve L shoulder strength and decrease pain with functional activities  Baseline: 07/31/23: HEP given to pt Goal status: INITIAL  LONG TERM GOALS: Target date: 09/11/23  Pt will improve FOTO to target score to demonstrate clinically significant improvement in functional mobility.  Baseline: 07/31/23: 69/72 Goal status: INITIAL  2.  Pt will decrease pain to <2/10 to demonstrate clinically significant improvement in pain levels with functional activity and ADL's.  Baseline: 07/31/23: 4/10 Goal status: INITIAL  3.  Pt will self report the ability to utilize her LUE to drive >57 min, w/ <8/46 pain, to demonstrate improvements with ability to complete work related activities.  Baseline: 07/31/23: Cannot drive w/ LUE for >86 minutes w/o 4/10 pain.  Goal status: INITIAL  PLAN:  PT FREQUENCY: 2x/week  PT DURATION: 6 weeks  PLANNED INTERVENTIONS: Therapeutic exercises, Therapeutic activity, Neuromuscular re-education, Balance training, Gait training, Patient/Family education, Self Care, Joint mobilization, Joint manipulation, Dry Needling, Electrical stimulation, Spinal manipulation, Spinal mobilization, Cryotherapy, Moist heat, Traction, and Manual therapy  PLAN FOR NEXT SESSION: Progress L shoulder RTC and peri-scapular strengthening exercises, add compound overhead exercises for L shoulder (body blade, rhythmic stabilizations)   Lovie Macadamia, SPT  Delphia Grates. Fairly IV, PT, DPT Physical Therapist- [place]  [facility]   08/04/2023, 12:54 PM

## 2023-08-07 ENCOUNTER — Ambulatory Visit: Payer: BC Managed Care – PPO

## 2023-08-07 DIAGNOSIS — M25519 Pain in unspecified shoulder: Secondary | ICD-10-CM | POA: Diagnosis not present

## 2023-08-07 DIAGNOSIS — G8929 Other chronic pain: Secondary | ICD-10-CM

## 2023-08-07 DIAGNOSIS — M25512 Pain in left shoulder: Secondary | ICD-10-CM | POA: Diagnosis not present

## 2023-08-07 DIAGNOSIS — M6281 Muscle weakness (generalized): Secondary | ICD-10-CM | POA: Diagnosis not present

## 2023-08-08 NOTE — Therapy (Signed)
OUTPATIENT PHYSICAL THERAPY SHOULDER TREATMENT     Patient Name: Margaret Brewer MRN: [id number] DOB:1966-01-29, 57 y.o., female Today's Date: 08/04/2023   END OF SESSION:   PT End of Session - 08/04/23 1011       Visit Number 3    Number of Visits 13     Date for PT Re-Evaluation 09/11/23     PT Start Time 1600     PT Stop Time 1644     PT Time Calculation (min) 44 min     Activity Tolerance Patient tolerated treatment well     Behavior During Therapy [facility] for tasks assessed/performed                         Past Medical History:  Diagnosis Date   Hyperlipidemia      with high HDL   Metatarsal fracture     Migraine without aura      varies with hormonal changes and weather changes             Past Surgical History:  Procedure Laterality Date   CHOLECYSTECTOMY   01/14/2002    Dr. Evette Cristal   ESOPHAGOGASTRODUODENOSCOPY (EGD) WITH PROPOFOL N/A 07/07/2015    Procedure: ESOPHAGOGASTRODUODENOSCOPY (EGD) WITH PROPOFOL;  Surgeon: Christena Deem, MD;  Location: [facility] ENDOSCOPY;  Service: Endoscopy;  Laterality: N/A;   OOPHORECTOMY   04/1986    Ovarian cyst, unilateral oophorectomy   TONSILLECTOMY        As a child            Patient Active Problem List    Diagnosis Date Noted   Hand paresthesia 07/27/2023   Vertigo 06/11/2023   Chest wall pain 05/14/2023   Right wrist pain 09/03/2022   Left foot pain 07/16/2020   Advance care planning 03/09/2015   Routine general medical examination at a health care facility 02/13/2012   Shoulder pain 02/13/2012   Migraine without aura 12/29/2007      PCP: Joaquim Nam, MD   REFERRING PROVIDER: Joaquim Nam, MD   REFERRING DIAG: M25.519 (ICD-10-CM) - Shoulder pain, unspecified chronicity, unspecified laterality   THERAPY DIAG:  Chronic left shoulder pain   Muscle weakness (generalized)   Rationale for Evaluation and Treatment: Rehabilitation   ONSET DATE: Multiple years, but most recent flare up 3 months  ago    SUBJECTIVE:                                                                                                                                                                                       SUBJECTIVE STATEMENT:  Pt reports 2-3/10 NPS in the L shoulder at today's session. She reports the pain is a dull ache, and felt mild soreness after last session.    Hand dominance: Right   PERTINENT HISTORY: Pt is a 57 y/o F w/ chronic L shoulder pain for many years. Pt's most recent flare up occurred with insidious onset when she was sitting at her desk at work and felt a sharp, shooting pain down the front of her Lt shoulder. Pt reports difficulty with driving >40 minutes, overhead movements and ADL's such as putting on her bra and lifting activities. Pt's sx are relieved by ceasing movement, and icing her L shoulder. Pt also reports having intense migraines that are currently being managed by chiropractic services. Pt has been dx with carpal tunnel in bilat hands L >R as well, and has been wearing a brace to treat this condition. She addresses concerns of pain in the anterior shoulder near the biceps tendon, and clicking and popping in the L shoulder with movement. Along with, difficulty sleeping on her left side.  However, there are no concerns expressed of N/T down the L arm.    PAIN:  Are you having pain? Yes: NPRS scale: 2/10 Pain location: Back of the L shoulder, into the front of the L shoulder Pain description: Dull and achy,  Aggravating factors: Overhead movements, lifting activities Relieving factors: Ceasing movements, icing, chiropractor  Least pain: 2/10 sitting, Worst pain: 4/10 with activity  No significant change in physical activity to contribute to recent flare up.     PRECAUTIONS: None   RED FLAGS: None      WEIGHT BEARING RESTRICTIONS: No   FALLS:  Has patient fallen in last 6 months? 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    UPPER EXTREMITY ROM: All AROM grossly WFL, mild deficits w/ L shoulder IR AROM.    UPPER EXTREMITY MMT:   MMT Right eval Left eval  Shoulder flexion 4+ 4  Shoulder extension 4+ 4-  Shoulder abduction 4+ 4-  Shoulder adduction      Shoulder internal rotation 4+ 4-  Shoulder external rotation 4+ 4  Middle trapezius 4- 3+  Lower trapezius 4- 3+  Elbow flexion 4+ 4+  Elbow extension      Wrist flexion      Wrist extension      Wrist ulnar deviation      Wrist radial deviation      Wrist pronation      Wrist supination      Grip strength (lbs) 5 4+  (Blank rows = not tested)   CERVICAL SPECIAL TESTS: Spurling's: Negative    SHOULDER SPECIAL TESTS: Impingement tests: Hawkins/Kennedy impingement test: positive  Rotator cuff assessment: Drop arm test: negative, Empty can test: negative, and Full can test: positive    JOINT MOBILITY TESTING:  No notable hypomobility noted in L shoulder w/ A<>P and P <> A joint mobilizations.    PALPATION:  To be assessed at next visit.              TODAY'S TREATMENT:                                                                                                                                         DATE: 08/07/23   UBE x2 min forward/ x2 min backward  Standing shoulder flexion+ isometric ER at wall w/ RTB (proximal to wrists) 2 x12   CW/CCW ball rolls on wall with L shoulder abduction to 90 deg 2 x30sec each direction   Body blade rhythmic stabilization exercise at 180 deg of L shoulder flexion 3 x30 sec  Alternating bird dog exercise in quadruped 2 x12/ each side  Standing L shoulder IR/ ER with shoulder  abducted 90 degrees w/ blue TB 1 x8/ each, GTB 1 x8/ each  Standing shoulder ext w/ black TB 3 x8     PATIENT EDUCATION: Education details: HEP given to pt  Person educated: Patient Education method: Medical illustrator Education comprehension: verbalized understanding and returned demonstration   HOME EXERCISE PROGRAM: Access Code: [email or web address] URL: [email or web address] Date: 08/01/2023 Prepared by: Ronnie Derby   Exercises - Standing Isometric Shoulder Internal Rotation at Doorway  - 1 x daily - 7 x weekly - 10 sets - 10 hold - Seated Scapular Retraction  - 1 x daily - 7 x weekly - 2 sets - 10 reps - Prone Shoulder Horizontal Abduction  - 1 x daily - 7 x weekly - 2 sets - 8 reps - Prone Single Arm Shoulder Y  - 1 x daily - 7 x weekly - 2 sets - 8 reps   ASSESSMENT:   CLINICAL IMPRESSION: Session focused on progressing L shoulder and bilat peri-scapular strengthening exercises. Pt displays improvements in L shoulder strength noted with increased tolerance to overhead activity during today's exercises. Pt continues to note mild L shoulder pain and increased muscle fatigue with exercises emphasizing L shoulder  internal rotation. Pt would benefit from skilled PT interventions to address strength and pain deficits to improve QoL and return to her PLOF.    OBJECTIVE IMPAIRMENTS: decreased ROM, decreased strength, and impaired UE functional use.    ACTIVITY LIMITATIONS: carrying, lifting, and reach over head   PARTICIPATION LIMITATIONS: cleaning, driving, and community activity   PERSONAL FACTORS: Age, Fitness, and Time since onset of injury/illness/exacerbation are also affecting patient's functional outcome.    REHAB POTENTIAL: Good   CLINICAL DECISION MAKING: Stable/uncomplicated   EVALUATION COMPLEXITY: Low     GOALS: Goals reviewed with patient? Yes   SHORT TERM GOALS: Target date: 08/21/23   Pt will be independent with HEP to improve L shoulder  strength and decrease pain with functional activities  Baseline: 07/31/23: HEP given to pt Goal status: INITIAL   LONG TERM GOALS: Target date: 09/11/23   Pt will improve FOTO to target score  to demonstrate clinically significant improvement in functional mobility.  Baseline: 07/31/23: 69/72 Goal status: INITIAL   2.  Pt will decrease pain to <2/10 to demonstrate clinically significant improvement in pain levels with functional activity and ADL's.  Baseline: 07/31/23: 4/10 Goal status: INITIAL   3.  Pt will self report the ability to utilize her LUE to drive >09 min, w/ <8/11 pain, to demonstrate improvements with ability to complete work related activities.  Baseline: 07/31/23: Cannot drive w/ LUE for >91 minutes w/o 4/10 pain.  Goal status: INITIAL   PLAN:   PT FREQUENCY: 2x/week   PT DURATION: 6 weeks   PLANNED INTERVENTIONS: Therapeutic exercises, Therapeutic activity, Neuromuscular re-education, Balance training, Gait training, Patient/Family education, Self Care, Joint mobilization, Joint manipulation, Dry Needling, Electrical stimulation, Spinal manipulation, Spinal mobilization, Cryotherapy, Moist heat, Traction, and Manual therapy   PLAN FOR NEXT SESSION: Continue to progress L shoulder RTC and peri-scapular strengthening exercises, advance overhead exercises.   Lovie Macadamia, SPT  Delphia Grates. Fairly IV, PT, DPT Physical Therapist- [place]  [facility]  08/04/2023, 12:54 PM

## 2023-08-11 ENCOUNTER — Ambulatory Visit: Payer: BC Managed Care – PPO

## 2023-08-11 DIAGNOSIS — M25512 Pain in left shoulder: Secondary | ICD-10-CM | POA: Diagnosis not present

## 2023-08-11 DIAGNOSIS — M25519 Pain in unspecified shoulder: Secondary | ICD-10-CM | POA: Diagnosis not present

## 2023-08-11 DIAGNOSIS — M6281 Muscle weakness (generalized): Secondary | ICD-10-CM

## 2023-08-11 DIAGNOSIS — G8929 Other chronic pain: Secondary | ICD-10-CM

## 2023-08-11 NOTE — Therapy (Addendum)
OUTPATIENT PHYSICAL THERAPY SHOULDER TREATMENT     Patient Name: Margaret Brewer MRN: [id number] DOB:10-06-1966, 57 y.o., female Today's Date: 08/04/2023   END OF SESSION:   PT End of Session - 08/04/23 1011       Visit Number 4    Number of Visits 13     Date for PT Re-Evaluation 09/11/23     PT Start Time 0814     PT Stop Time 0900     PT Time Calculation (min) 46 min     Activity Tolerance Patient tolerated treatment well     Behavior During Therapy [facility] for tasks assessed/performed                         Past Medical History:  Diagnosis Date   Hyperlipidemia      with high HDL   Metatarsal fracture     Migraine without aura      varies with hormonal changes and weather changes             Past Surgical History:  Procedure Laterality Date   CHOLECYSTECTOMY   01/14/2002    Dr. Evette Cristal   ESOPHAGOGASTRODUODENOSCOPY (EGD) WITH PROPOFOL N/A 07/07/2015    Procedure: ESOPHAGOGASTRODUODENOSCOPY (EGD) WITH PROPOFOL;  Surgeon: Christena Deem, MD;  Location: [facility] ENDOSCOPY;  Service: Endoscopy;  Laterality: N/A;   OOPHORECTOMY   04/1986    Ovarian cyst, unilateral oophorectomy   TONSILLECTOMY        As a child            Patient Active Problem List    Diagnosis Date Noted   Hand paresthesia 07/27/2023   Vertigo 06/11/2023   Chest wall pain 05/14/2023   Right wrist pain 09/03/2022   Left foot pain 07/16/2020   Advance care planning 03/09/2015   Routine general medical examination at a health care facility 02/13/2012   Shoulder pain 02/13/2012   Migraine without aura 12/29/2007      PCP: Joaquim Nam, MD   REFERRING PROVIDER: Joaquim Nam, MD   REFERRING DIAG: M25.519 (ICD-10-CM) - Shoulder pain, unspecified chronicity, unspecified laterality   THERAPY DIAG:  Chronic left shoulder pain   Muscle weakness (generalized)   Rationale for Evaluation and Treatment: Rehabilitation   ONSET DATE: Multiple years, but most recent flare up 3 months  ago    SUBJECTIVE:                                                                                                                                                                                       SUBJECTIVE STATEMENT:  Pt reports 1-2/10 NPS in the L shoulder at today's session. She reports muscular soreness in bilat superior shoulders after last session and Lt shoulder pain from wearing her seatbelt after riding in the [place] over [place]    Hand dominance: Right   PERTINENT HISTORY: Pt is a 57 y/o F w/ chronic L shoulder pain for many years. Pt's most recent flare up occurred with insidious onset when she was sitting at her desk at work and felt a sharp, shooting pain down the front of her Lt shoulder. Pt reports difficulty with driving >02 minutes, overhead movements and ADL's such as putting on her bra and lifting activities. Pt's sx are relieved by ceasing movement, and icing her L shoulder. Pt also reports having intense migraines that are currently being managed by chiropractic services. Pt has been dx with carpal tunnel in bilat hands L >R as well, and has been wearing a brace to treat this condition. She addresses concerns of pain in the anterior shoulder near the biceps tendon, and clicking and popping in the L shoulder with movement. Along with, difficulty sleeping on her left side.  However, there are no concerns expressed of N/T down the L arm.    PAIN:  Are you having pain? Yes: NPRS scale: 1-2/10 Pain location: Back of the L shoulder, into the front of the L shoulder Pain description: Dull and achy,  Aggravating factors: Overhead movements, lifting activities Relieving factors: Ceasing movements, icing, chiropractor  Least pain: 2/10 sitting, Worst pain: 4/10 with activity  No significant change in physical activity to contribute to recent flare up.     PRECAUTIONS: None   RED FLAGS: None      WEIGHT BEARING RESTRICTIONS: No   FALLS:   Has patient fallen in last 6 months? 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    UPPER EXTREMITY ROM: All AROM grossly WFL, mild deficits w/ L shoulder IR AROM.    UPPER EXTREMITY MMT:   MMT Right eval Left eval  Shoulder flexion 4+ 4  Shoulder extension 4+ 4-  Shoulder abduction 4+ 4-  Shoulder adduction      Shoulder internal rotation 4+ 4-  Shoulder external rotation 4+ 4  Middle trapezius 4- 3+  Lower trapezius 4- 3+  Elbow flexion 4+ 4+  Elbow extension      Wrist flexion      Wrist extension      Wrist ulnar deviation      Wrist radial deviation      Wrist pronation      Wrist supination      Grip strength (lbs) 5 4+  (Blank rows = not tested)   CERVICAL SPECIAL TESTS: Spurling's: Negative    SHOULDER SPECIAL TESTS: Impingement tests: Hawkins/Kennedy impingement test: positive  Rotator cuff assessment: Drop arm test: negative, Empty can test: negative, and Full can test: positive    JOINT MOBILITY TESTING:  No notable  hypomobility noted in L shoulder w/ A<>P and P <> A joint mobilizations.    PALPATION:  To be assessed at next visit.              TODAY'S TREATMENT:                                                                                                                                         DATE: 08/11/23   UBE x2 min forward/ x2 min backward  Standing shoulder flexion+ isometric ER at wall w/ GTB (proximal to wrists) 2 x12   CW/CCW ball rolls on wall with L shoulder abduction to 90 deg 1 x30sec each direction, L shoulder abduction to 150 deg 1 x30 sec each direction.   Quadruped scapular push- ups  3 x12. Hands on DB's to keep in neutral position.  Standing scaption full ROM w/ 4# DB in bilat UE 's 2 x10   Standing "school bus drivers" (bilat shoulder flexion to 90 deg, alternating pronation and supination of UE 's) 3 x30 sec w/ 5# weighted plate for RTC stabilization  Standing L shoulder IR/ ER with shoulder abducted 90 degrees w/ GTB 3 x12/ each  Seated w/ ice pack on L shoulder x 5 minutes (not billed)      PATIENT EDUCATION: Education details: HEP given to pt  Person educated: Patient Education method: Medical illustrator Education comprehension: verbalized understanding and returned demonstration   HOME EXERCISE PROGRAM: Access Code: [email or web address] URL: [email or web address] Date: 08/01/2023 Prepared by: Ronnie Derby   Exercises - Standing Isometric Shoulder Internal Rotation at Doorway  - 1 x daily - 7 x weekly - 10 sets - 10 hold - Seated Scapular Retraction  - 1 x daily - 7 x weekly - 2 sets - 10 reps - Prone Shoulder Horizontal Abduction  - 1 x daily - 7 x weekly - 2 sets - 8 reps - Prone Single Arm Shoulder Y  - 1 x daily - 7 x weekly - 2 sets - 8 reps   ASSESSMENT:   CLINICAL IMPRESSION: Session focused on progressing L shoulder and bilat peri-scapular strengthening exercises. Pt notes increased activity tolerance w/ L shoulder overhead activity and bilat shoulder stabilization exercises. This is displayed with the addition of weights being added to overhead movements, and increasing resistance bands at today's session. Pt continues to note intermittent muscle fatigue during L shoulder exercises involving abduction and IR of the shoulder and would continue to benefit from skilled PT interventions to address strength and pain deficits to improve QoL and return to PLOF.    OBJECTIVE IMPAIRMENTS: decreased ROM, decreased strength, and impaired UE functional use.    ACTIVITY LIMITATIONS: carrying, lifting, and reach over head   PARTICIPATION  LIMITATIONS: cleaning, driving, and community activity   PERSONAL FACTORS: Age, Fitness, and Time since onset of injury/illness/exacerbation are also affecting patient's functional outcome.    REHAB  POTENTIAL: Good   CLINICAL DECISION MAKING: Stable/uncomplicated   EVALUATION COMPLEXITY: Low     GOALS: Goals reviewed with patient? Yes   SHORT TERM GOALS: Target date: 08/21/23   Pt will be independent with HEP to improve L shoulder strength and decrease pain with functional activities  Baseline: 07/31/23: HEP given to pt Goal status: INITIAL   LONG TERM GOALS: Target date: 09/11/23   Pt will improve FOTO to target score to demonstrate clinically significant improvement in functional mobility.  Baseline: 07/31/23: 69/72 Goal status: INITIAL   2.  Pt will decrease pain to <2/10 to demonstrate clinically significant improvement in pain levels with functional activity and ADL's.  Baseline: 07/31/23: 4/10 Goal status: INITIAL   3.  Pt will self report the ability to utilize her LUE to drive >16 min, w/ <1/09 pain, to demonstrate improvements with ability to complete work related activities.  Baseline: 07/31/23: Cannot drive w/ LUE for >60 minutes w/o 4/10 pain.  Goal status: INITIAL   PLAN:   PT FREQUENCY: 2x/week   PT DURATION: 6 weeks   PLANNED INTERVENTIONS: Therapeutic exercises, Therapeutic activity, Neuromuscular re-education, Balance training, Gait training, Patient/Family education, Self Care, Joint mobilization, Joint manipulation, Dry Needling, Electrical stimulation, Spinal manipulation, Spinal mobilization, Cryotherapy, Moist heat, Traction, and Manual therapy   PLAN FOR NEXT SESSION: Continue to progress L shoulder RTC and peri-scapular strengthening exercises, assess tolerance to overhead activity at last visit.    Lovie Macadamia, SPT  Delphia Grates. Fairly IV, PT, DPT Physical Therapist- [place]  [facility]  08/04/2023, 12:54 PM

## 2023-08-13 ENCOUNTER — Ambulatory Visit: Payer: BC Managed Care – PPO

## 2023-08-13 DIAGNOSIS — M6281 Muscle weakness (generalized): Secondary | ICD-10-CM | POA: Diagnosis not present

## 2023-08-13 DIAGNOSIS — G8929 Other chronic pain: Secondary | ICD-10-CM | POA: Diagnosis not present

## 2023-08-13 DIAGNOSIS — M25519 Pain in unspecified shoulder: Secondary | ICD-10-CM | POA: Diagnosis not present

## 2023-08-13 DIAGNOSIS — G43719 Chronic migraine without aura, intractable, without status migrainosus: Secondary | ICD-10-CM | POA: Diagnosis not present

## 2023-08-13 DIAGNOSIS — M25512 Pain in left shoulder: Secondary | ICD-10-CM | POA: Diagnosis not present

## 2023-08-13 DIAGNOSIS — M542 Cervicalgia: Secondary | ICD-10-CM | POA: Diagnosis not present

## 2023-08-13 NOTE — Therapy (Addendum)
OUTPATIENT PHYSICAL THERAPY SHOULDER TREATMENT     Patient Name: Margaret Brewer MRN: [id number] DOB:1966/08/04, 57 y.o., female Today's Date: 08/04/2023   END OF SESSION:   PT End of Session - 08/04/23 1011       Visit Number 5    Number of Visits 13     Date for PT Re-Evaluation 09/11/23     PT Start Time 1116    PT Stop Time 1200     PT Time Calculation (min) 44 min     Activity Tolerance Patient tolerated treatment well     Behavior During Therapy [facility] for tasks assessed/performed                         Past Medical History:  Diagnosis Date   Hyperlipidemia      with high HDL   Metatarsal fracture     Migraine without aura      varies with hormonal changes and weather changes             Past Surgical History:  Procedure Laterality Date   CHOLECYSTECTOMY   01/14/2002    Dr. Evette Cristal   ESOPHAGOGASTRODUODENOSCOPY (EGD) WITH PROPOFOL N/A 07/07/2015    Procedure: ESOPHAGOGASTRODUODENOSCOPY (EGD) WITH PROPOFOL;  Surgeon: Christena Deem, MD;  Location: [facility] ENDOSCOPY;  Service: Endoscopy;  Laterality: N/A;   OOPHORECTOMY   04/1986    Ovarian cyst, unilateral oophorectomy   TONSILLECTOMY        As a child            Patient Active Problem List    Diagnosis Date Noted   Hand paresthesia 07/27/2023   Vertigo 06/11/2023   Chest wall pain 05/14/2023   Right wrist pain 09/03/2022   Left foot pain 07/16/2020   Advance care planning 03/09/2015   Routine general medical examination at a health care facility 02/13/2012   Shoulder pain 02/13/2012   Migraine without aura 12/29/2007      PCP: Joaquim Nam, MD   REFERRING PROVIDER: Joaquim Nam, MD   REFERRING DIAG: M25.519 (ICD-10-CM) - Shoulder pain, unspecified chronicity, unspecified laterality   THERAPY DIAG:  Chronic left shoulder pain   Muscle weakness (generalized)   Rationale for Evaluation and Treatment: Rehabilitation   ONSET DATE: Multiple years, but most recent flare up 3 months  ago    SUBJECTIVE:                                                                                                                                                                                       SUBJECTIVE STATEMENT:  Pt reports 2/10 NPS in the L shoulder at today's session. She reports she is having a migraine today which could contributing to her L shoulder pain as well.    Hand dominance: Right   PERTINENT HISTORY: Pt is a 57 y/o F w/ chronic L shoulder pain for many years. Pt's most recent flare up occurred with insidious onset when she was sitting at her desk at work and felt a sharp, shooting pain down the front of her Lt shoulder. Pt reports difficulty with driving >09 minutes, overhead movements and ADL's such as putting on her bra and lifting activities. Pt's sx are relieved by ceasing movement, and icing her L shoulder. Pt also reports having intense migraines that are currently being managed by chiropractic services. Pt has been dx with carpal tunnel in bilat hands L >R as well, and has been wearing a brace to treat this condition. She addresses concerns of pain in the anterior shoulder near the biceps tendon, and clicking and popping in the L shoulder with movement. Along with, difficulty sleeping on her left side.  However, there are no concerns expressed of N/T down the L arm.    PAIN:  Are you having pain? Yes: NPRS scale: 1-2/10 Pain location: Back of the L shoulder, into the front of the L shoulder Pain description: Dull and achy,  Aggravating factors: Overhead movements, lifting activities Relieving factors: Ceasing movements, icing, chiropractor  Least pain: 2/10 sitting, Worst pain: 4/10 with activity  No significant change in physical activity to contribute to recent flare up.     PRECAUTIONS: None   RED FLAGS: None      WEIGHT BEARING RESTRICTIONS: No   FALLS:  Has patient fallen in last 6 months? 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    UPPER EXTREMITY ROM: All AROM grossly WFL, mild deficits w/ L shoulder IR AROM.    UPPER EXTREMITY MMT:   MMT Right eval Left eval  Shoulder flexion 4+ 4  Shoulder extension 4+ 4-  Shoulder abduction 4+ 4-  Shoulder adduction      Shoulder internal rotation 4+ 4-  Shoulder external rotation 4+ 4  Middle trapezius 4- 3+  Lower trapezius 4- 3+  Elbow flexion 4+ 4+  Elbow extension      Wrist flexion      Wrist extension      Wrist ulnar deviation      Wrist radial deviation      Wrist pronation      Wrist supination      Grip strength (lbs) 5 4+  (Blank rows = not tested)   CERVICAL SPECIAL TESTS: Spurling's: Negative    SHOULDER SPECIAL TESTS: Impingement tests: Hawkins/Kennedy impingement test: positive  Rotator cuff assessment: Drop arm test: negative, Empty can test: negative, and Full can test: positive    JOINT MOBILITY TESTING:  No notable hypomobility noted in L shoulder w/ A<>P and P <> A joint mobilizations.  PALPATION:  To be assessed at next visit.              TODAY'S TREATMENT:                                                                                                                                         DATE: 08/13/23   UBE x3 min forward/ x3 min backward  Standing shoulder flexion+ isometric ER at wall w/ GTB (proximal to wrists) 2 x10  Body blade L shoulder rhythmic oscillations 3 x30sec sustained overhead  Overhead triceps extension w/ kettlebell (marches) 3 x30sec   Staggered T-row LUE w/ 10# weight 3 x15  Standing scaption full ROM w/ 1 x10 4# DB, 1 x10 6# DB in bilat UE 's   Quadruped  scapular push- ups 2 x12. Hands on DB's to keep in neutral position.  Standing wall push-ups 2 x10   Standing re-bounder throws with 2KG thera-ball w/ L shoulder IR 2 x10, v/c needed to facilitate movement   Standing re-bounder throws with 2KG thera-ball w/ L shoulder abducted to 90 deg w/ IR 2 x10, v/c needed to facilitate movement   PATIENT EDUCATION: Education details: HEP given to pt  Person educated: Patient Education method: Medical illustrator Education comprehension: verbalized understanding and returned demonstration   HOME EXERCISE PROGRAM: Access Code: [email or web address] URL: [email or web address] Date: 08/01/2023 Prepared by: Ronnie Derby   Exercises - Standing Isometric Shoulder Internal Rotation at Doorway  - 1 x daily - 7 x weekly - 10 sets - 10 hold - Seated Scapular Retraction  - 1 x daily - 7 x weekly - 2 sets - 10 reps - Prone Shoulder Horizontal Abduction  - 1 x daily - 7 x weekly - 2 sets - 8 reps - Prone Single Arm Shoulder Y  - 1 x daily - 7 x weekly - 2 sets - 8 reps   ASSESSMENT:   CLINICAL IMPRESSION: Session focused on progressing L shoulder and bilat peri-scapular strengthening exercises. Pt continues to note improvements in L shoulder strength and mobility noted w/ increased activity tolerance with exercises at today's session. Pt continues to note difficulty with overhead movement specifically with the initiation and eccentric lowering during the movement. Pt would continue to benefit from skilled PT interventions to address remaining strength and pain deficits to improve QoL and return to PLOF.  OBJECTIVE IMPAIRMENTS: decreased ROM, decreased strength, and impaired UE functional use.    ACTIVITY LIMITATIONS: carrying, lifting, and reach over head   PARTICIPATION LIMITATIONS: cleaning, driving, and community activity   PERSONAL FACTORS: Age, Fitness, and Time since onset of injury/illness/exacerbation are also affecting patient's  functional outcome.    REHAB POTENTIAL: Good   CLINICAL DECISION MAKING: Stable/uncomplicated   EVALUATION COMPLEXITY: Low     GOALS: Goals reviewed with patient? Yes   SHORT TERM GOALS: Target date: 08/21/23   Pt will be independent with  HEP to improve L shoulder strength and decrease pain with functional activities  Baseline: 07/31/23: HEP given to pt Goal status: INITIAL   LONG TERM GOALS: Target date: 09/11/23   Pt will improve FOTO to target score to demonstrate clinically significant improvement in functional mobility.  Baseline: 07/31/23: 69/72 Goal status: INITIAL   2.  Pt will decrease pain to <2/10 to demonstrate clinically significant improvement in pain levels with functional activity and ADL's.  Baseline: 07/31/23: 4/10 Goal status: INITIAL   3.  Pt will self report the ability to utilize her LUE to drive >23 min, w/ <5/57 pain, to demonstrate improvements with ability to complete work related activities.  Baseline: 07/31/23: Cannot drive w/ LUE for >32 minutes w/o 4/10 pain.  Goal status: INITIAL   PLAN:   PT FREQUENCY: 2x/week   PT DURATION: 6 weeks   PLANNED INTERVENTIONS: Therapeutic exercises, Therapeutic activity, Neuromuscular re-education, Balance training, Gait training, Patient/Family education, Self Care, Joint mobilization, Joint manipulation, Dry Needling, Electrical stimulation, Spinal manipulation, Spinal mobilization, Cryotherapy, Moist heat, Traction, and Manual therapy   PLAN FOR NEXT SESSION: Continue to progress to more L shoulder overhead movements.    Lovie Macadamia, SPT  Delphia Grates. Fairly IV, PT, DPT Physical Therapist- [place]  [facility]  08/04/2023, 12:54 PM

## 2023-08-14 ENCOUNTER — Telehealth: Payer: Self-pay | Admitting: Family Medicine

## 2023-08-14 NOTE — Telephone Encounter (Signed)
Called and spoke with patient she was calling to inform Dr. Para March that since being on the lower dose of levonorgestrel-ethinyl estradiol (AVIANE) 0.1-20 MG-MCG tablet her headaches have worsened. She met with her neurologist yesterday and they tallied up her headaches over the last couple months, 15 in July and 17 in August. She stated she thinks she may need to go back to her original dose of birth control to try and get these back under control. Please advise.  If the plan is to go back to original dosing she wants to know what week to change over. She stated her period week is next week, she wants to know if she should start it then or wait and start the next week? Please advise

## 2023-08-14 NOTE — Telephone Encounter (Signed)
Patient is requesting a call back when possible regarding medication levonorgestrel-ethinyl estradiol (AVIANE) 0.1-20 MG-MCG tablet , patient can be reached at mobile number.

## 2023-08-15 MED ORDER — ENSKYCE 0.15-30 MG-MCG PO TABS: 1.0000 | ORAL_TABLET | Freq: Every day | ORAL | 3 refills | Status: DC

## 2023-08-15 NOTE — Addendum Note (Signed)
Addended by: Joaquim Nam on: 08/15/2023 07:06 AM   Modules accepted: Orders

## 2023-08-15 NOTE — Telephone Encounter (Signed)
Unable to reach patient. Left voicemail to return call to our office.   

## 2023-08-15 NOTE — Telephone Encounter (Signed)
PT returned call, advised of message below. Patient agreed and expressed understanding

## 2023-08-15 NOTE — Telephone Encounter (Signed)
I assuming she will have a period next week. If so, I would start the old rx after that. I sent the old rx to [id number] [place], [place] - [address]  I hope she feels better in the near future.  Thanks.

## 2023-08-21 ENCOUNTER — Ambulatory Visit: Payer: BC Managed Care – PPO | Attending: Family Medicine

## 2023-08-21 DIAGNOSIS — M25512 Pain in left shoulder: Secondary | ICD-10-CM | POA: Diagnosis not present

## 2023-08-21 DIAGNOSIS — G8929 Other chronic pain: Secondary | ICD-10-CM | POA: Insufficient documentation

## 2023-08-21 DIAGNOSIS — M6281 Muscle weakness (generalized): Secondary | ICD-10-CM | POA: Insufficient documentation

## 2023-08-21 NOTE — Therapy (Addendum)
OUTPATIENT PHYSICAL THERAPY SHOULDER TREATMENT     Patient Name: Margaret Brewer MRN: [id number] DOB:11-30-1966, 57 y.o., female Today's Date: 08/04/2023   END OF SESSION:   PT End of Session - 08/04/23 1011       Visit Number 6    Number of Visits 13     Date for PT Re-Evaluation 09/11/23     PT Start Time 1300    PT Stop Time 1344    PT Time Calculation (min) 44 min     Activity Tolerance Patient tolerated treatment well     Behavior During Therapy [facility] for tasks assessed/performed                         Past Medical History:  Diagnosis Date   Hyperlipidemia      with high HDL   Metatarsal fracture     Migraine without aura      varies with hormonal changes and weather changes             Past Surgical History:  Procedure Laterality Date   CHOLECYSTECTOMY   01/14/2002    Dr. Evette Cristal   ESOPHAGOGASTRODUODENOSCOPY (EGD) WITH PROPOFOL N/A 07/07/2015    Procedure: ESOPHAGOGASTRODUODENOSCOPY (EGD) WITH PROPOFOL;  Surgeon: Christena Deem, MD;  Location: [facility] ENDOSCOPY;  Service: Endoscopy;  Laterality: N/A;   OOPHORECTOMY   04/1986    Ovarian cyst, unilateral oophorectomy   TONSILLECTOMY        As a child            Patient Active Problem List    Diagnosis Date Noted   Hand paresthesia 07/27/2023   Vertigo 06/11/2023   Chest wall pain 05/14/2023   Right wrist pain 09/03/2022   Left foot pain 07/16/2020   Advance care planning 03/09/2015   Routine general medical examination at a health care facility 02/13/2012   Shoulder pain 02/13/2012   Migraine without aura 12/29/2007      PCP: Joaquim Nam, MD   REFERRING PROVIDER: Joaquim Nam, MD   REFERRING DIAG: M25.519 (ICD-10-CM) - Shoulder pain, unspecified chronicity, unspecified laterality   THERAPY DIAG:  Chronic left shoulder pain   Muscle weakness (generalized)   Rationale for Evaluation and Treatment: Rehabilitation   ONSET DATE: Multiple years, but most recent flare up 3 months  ago    SUBJECTIVE:                                                                                                                                                                                       SUBJECTIVE STATEMENT:  Pt reports 4/10 NPS in the L shoulder at today's session. She reports being able to sleep on the L shoulder throughout the night without being awaken, however after waking up the L shoulder pain increased.    Hand dominance: Right   PERTINENT HISTORY: Pt is a 57 y/o F w/ chronic L shoulder pain for many years. Pt's most recent flare up occurred with insidious onset when she was sitting at her desk at work and felt a sharp, shooting pain down the front of her Lt shoulder. Pt reports difficulty with driving >45 minutes, overhead movements and ADL's such as putting on her bra and lifting activities. Pt's sx are relieved by ceasing movement, and icing her L shoulder. Pt also reports having intense migraines that are currently being managed by chiropractic services. Pt has been dx with carpal tunnel in bilat hands L >R as well, and has been wearing a brace to treat this condition. She addresses concerns of pain in the anterior shoulder near the biceps tendon, and clicking and popping in the L shoulder with movement. Along with, difficulty sleeping on her left side.  However, there are no concerns expressed of N/T down the L arm.    PAIN:  Are you having pain? Yes: NPRS scale: 4/10 Pain location: Back of the L shoulder, into the front of the L shoulder Pain description: Dull and achy,  Aggravating factors: Overhead movements, lifting activities Relieving factors: Ceasing movements, icing, chiropractor  Least pain: 2/10 sitting, Worst pain: 4/10 with activity  No significant change in physical activity to contribute to recent flare up.     PRECAUTIONS: None   RED FLAGS: None      WEIGHT BEARING RESTRICTIONS: No   FALLS:  Has patient fallen in last 6 months? 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    UPPER EXTREMITY ROM: All AROM grossly WFL, mild deficits w/ L shoulder IR AROM.    UPPER EXTREMITY MMT:   MMT Right eval Left eval  Shoulder flexion 4+ 4  Shoulder extension 4+ 4-  Shoulder abduction 4+ 4-  Shoulder adduction      Shoulder internal rotation 4+ 4-  Shoulder external rotation 4+ 4  Middle trapezius 4- 3+  Lower trapezius 4- 3+  Elbow flexion 4+ 4+  Elbow extension      Wrist flexion      Wrist extension      Wrist ulnar deviation      Wrist radial deviation      Wrist pronation      Wrist supination      Grip strength (lbs) 5 4+  (Blank rows = not tested)   CERVICAL SPECIAL TESTS: Spurling's: Negative    SHOULDER SPECIAL TESTS: Impingement tests: Hawkins/Kennedy impingement test: positive  Rotator cuff assessment: Drop arm test: negative, Empty can test: negative, and Full can test: positive    JOINT MOBILITY TESTING:  No notable hypomobility noted in L shoulder w/ A<>P  and P <> A joint mobilizations.    PALPATION:  To be assessed at next visit.              TODAY'S TREATMENT:                                                                                                                                         DATE: 08/21/23   UBE x3 min forward/ x3 min backward lvl 3.0   Standing shoulder flexion+ isometric ER at wall w/ RTB (proximal to wrists) 1 x10, GTB (proximal to wrists) x10   Standing wall walks w/ RTB 3 x10' down and back   Single arm overhead press w/ 10# KB 3 x10   Bird dogs in quadruped w/ 3# AW's at wrists 2 x10  Medicine ball overhead throws  to ground w/ 15# medicine ball 2 x8   Staggered T-row LUE w/ 10# DB 1 x12, 15# DB 2 x12  Standing scaption full ROM w/ 2 x10 5# DB in bilat UE 's     PATIENT EDUCATION: Education details: HEP given to pt  Person educated: Patient Education method: Medical illustrator Education comprehension: verbalized understanding and returned demonstration   HOME EXERCISE PROGRAM: Access Code: [email or web address] URL: [email or web address] Date: 08/01/2023 Prepared by: Ronnie Derby   Exercises - Standing Isometric Shoulder Internal Rotation at Doorway  - 1 x daily - 7 x weekly - 10 sets - 10 hold - Seated Scapular Retraction  - 1 x daily - 7 x weekly - 2 sets - 10 reps - Prone Shoulder Horizontal Abduction  - 1 x daily - 7 x weekly - 2 sets - 8 reps - Prone Single Arm Shoulder Y  - 1 x daily - 7 x weekly - 2 sets - 8 reps   ASSESSMENT:   CLINICAL IMPRESSION: Session focused on progressing L shoulder and bilat peri-scapular strengthening exercises. Pt continues to note improvements in L shoulder strength and mobility noted w/ increased activity tolerance with exercises at today's session. Pt also shows progression in pt reported functional tasks and ADL's involving the L shoulder including sleeping on her L side at night. Pt still reports pain in the L shoulder w/ some overhead activities, and after waking from prolonged periods of resting on the L side. Pt would continue to benefit from skilled PT interventions to address remaining strength and pain deficits to improve QoL and return to PLOF.  OBJECTIVE IMPAIRMENTS: decreased ROM, decreased strength, and impaired UE functional use.    ACTIVITY LIMITATIONS: carrying, lifting, and reach over head   PARTICIPATION LIMITATIONS: cleaning, driving, and community activity   PERSONAL FACTORS: Age, Fitness, and Time since onset of injury/illness/exacerbation are also affecting patient's functional outcome.    REHAB POTENTIAL: Good    CLINICAL DECISION MAKING: Stable/uncomplicated   EVALUATION COMPLEXITY: Low     GOALS: Goals reviewed with patient? Yes   SHORT  TERM GOALS: Target date: 08/21/23   Pt will be independent with HEP to improve L shoulder strength and decrease pain with functional activities  Baseline: 07/31/23: HEP given to pt Goal status: INITIAL   LONG TERM GOALS: Target date: 09/11/23   Pt will improve FOTO to target score to demonstrate clinically significant improvement in functional mobility.  Baseline: 07/31/23: 69/72 Goal status: INITIAL   2.  Pt will decrease pain to <2/10 to demonstrate clinically significant improvement in pain levels with functional activity and ADL's.  Baseline: 07/31/23: 4/10 Goal status: INITIAL   3.  Pt will self report the ability to utilize her LUE to drive >16 min, w/ <1/09 pain, to demonstrate improvements with ability to complete work related activities.  Baseline: 07/31/23: Cannot drive w/ LUE for >60 minutes w/o 4/10 pain.  Goal status: INITIAL   PLAN:   PT FREQUENCY: 2x/week   PT DURATION: 6 weeks   PLANNED INTERVENTIONS: Therapeutic exercises, Therapeutic activity, Neuromuscular re-education, Balance training, Gait training, Patient/Family education, Self Care, Joint mobilization, Joint manipulation, Dry Needling, Electrical stimulation, Spinal manipulation, Spinal mobilization, Cryotherapy, Moist heat, Traction, and Manual therapy   PLAN FOR NEXT SESSION: Continue to progress to more L shoulder overhead movements, increase # used during exercises   Lovie Macadamia, SPT  Delphia Grates. Fairly IV, PT, DPT Physical Therapist- [place]  [facility]  08/21/2023, 3:29 PM

## 2023-08-25 ENCOUNTER — Other Ambulatory Visit: Payer: Self-pay | Admitting: Family Medicine

## 2023-08-25 DIAGNOSIS — Z1231 Encounter for screening mammogram for malignant neoplasm of breast: Secondary | ICD-10-CM

## 2023-08-28 ENCOUNTER — Ambulatory Visit: Payer: BC Managed Care – PPO

## 2023-08-28 DIAGNOSIS — M6281 Muscle weakness (generalized): Secondary | ICD-10-CM

## 2023-08-28 DIAGNOSIS — G8929 Other chronic pain: Secondary | ICD-10-CM

## 2023-08-28 DIAGNOSIS — M25512 Pain in left shoulder: Secondary | ICD-10-CM | POA: Diagnosis not present

## 2023-08-28 NOTE — Therapy (Addendum)
OUTPATIENT PHYSICAL THERAPY SHOULDER TREATMENT     Patient Name: Margaret Brewer MRN: [id number] DOB:[birth year], 57 y.o., female Today's Date: 08/04/2023   END OF SESSION:   PT End of Session - 08/04/23 1011       Visit Number 7    Number of Visits 13     Date for PT Re-Evaluation 09/11/23     PT Start Time 1601    PT Stop Time 1643    PT Time Calculation (min) 42 min     Activity Tolerance Patient tolerated treatment well     Behavior During Therapy [facility] for tasks assessed/performed                         Past Medical History:  Diagnosis Date   Hyperlipidemia      with high HDL   Metatarsal fracture     Migraine without aura      varies with hormonal changes and weather changes             Past Surgical History:  Procedure Laterality Date   CHOLECYSTECTOMY   01/14/2002    Dr. Evette Cristal   ESOPHAGOGASTRODUODENOSCOPY (EGD) WITH PROPOFOL N/A 07/07/2015    Procedure: ESOPHAGOGASTRODUODENOSCOPY (EGD) WITH PROPOFOL;  Surgeon: Christena Deem, MD;  Location: [facility] ENDOSCOPY;  Service: Endoscopy;  Laterality: N/A;   OOPHORECTOMY   04/1986    Ovarian cyst, unilateral oophorectomy   TONSILLECTOMY        As a child            Patient Active Problem List    Diagnosis Date Noted   Hand paresthesia 07/27/2023   Vertigo 06/11/2023   Chest wall pain 05/14/2023   Right wrist pain 09/03/2022   Left foot pain 07/16/2020   Advance care planning 03/09/2015   Routine general medical examination at a health care facility 02/13/2012   Shoulder pain 02/13/2012   Migraine without aura 12/29/2007      PCP: Joaquim Nam, MD   REFERRING PROVIDER: Joaquim Nam, MD   REFERRING DIAG: M25.519 (ICD-10-CM) - Shoulder pain, unspecified chronicity, unspecified laterality   THERAPY DIAG:  Chronic left shoulder pain   Muscle weakness (generalized)   Rationale for Evaluation and Treatment: Rehabilitation   ONSET DATE: Multiple years, but most recent flare up 3 months  ago    SUBJECTIVE:                                                                                                                                                                                       SUBJECTIVE STATEMENT:  Pt reports 3-4/10 NPS in the L shoulder at today's session. She reports increased soreness after last visit, however this soreness has subsided.    Hand dominance: Right   PERTINENT HISTORY: Pt is a 57 y/o F w/ chronic L shoulder pain for many years. Pt's most recent flare up occurred with insidious onset when she was sitting at her desk at work and felt a sharp, shooting pain down the front of her Lt shoulder. Pt reports difficulty with driving >29 minutes, overhead movements and ADL's such as putting on her bra and lifting activities. Pt's sx are relieved by ceasing movement, and icing her L shoulder. Pt also reports having intense migraines that are currently being managed by chiropractic services. Pt has been dx with carpal tunnel in bilat hands L >R as well, and has been wearing a brace to treat this condition. She addresses concerns of pain in the anterior shoulder near the biceps tendon, and clicking and popping in the L shoulder with movement. Along with, difficulty sleeping on her left side.  However, there are no concerns expressed of N/T down the L arm.    PAIN:  Are you having pain? Yes: NPRS scale: 4/10 Pain location: Back of the L shoulder, into the front of the L shoulder Pain description: Dull and achy,  Aggravating factors: Overhead movements, lifting activities Relieving factors: Ceasing movements, icing, chiropractor  Least pain: 2/10 sitting, Worst pain: 4/10 with activity  No significant change in physical activity to contribute to recent flare up.     PRECAUTIONS: None   RED FLAGS: None      WEIGHT BEARING RESTRICTIONS: No   FALLS:  Has patient fallen in last 6 months? 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    UPPER EXTREMITY ROM: All AROM grossly WFL, mild deficits w/ L shoulder IR AROM.    UPPER EXTREMITY MMT:   MMT Right eval Left eval  Shoulder flexion 4+ 4  Shoulder extension 4+ 4-  Shoulder abduction 4+ 4-  Shoulder adduction      Shoulder internal rotation 4+ 4-  Shoulder external rotation 4+ 4  Middle trapezius 4- 3+  Lower trapezius 4- 3+  Elbow flexion 4+ 4+  Elbow extension      Wrist flexion      Wrist extension      Wrist ulnar deviation      Wrist radial deviation      Wrist pronation      Wrist supination      Grip strength (lbs) 5 4+  (Blank rows = not tested)   CERVICAL SPECIAL TESTS: Spurling's: Negative    SHOULDER SPECIAL TESTS: Impingement tests: Hawkins/Kennedy impingement test: positive  Rotator cuff assessment: Drop arm test: negative, Empty can test: negative, and Full can test: positive    JOINT MOBILITY TESTING:  No notable hypomobility noted in L shoulder w/ A<>P and P <> A joint mobilizations.    PALPATION:  To be  assessed at next visit.              TODAY'S TREATMENT:                                                                                                                                         DATE: 08/29/23   UBE x2 min forward/ x2 min backward lvl 3.0  Standing shoulder flexion ball rolls with red physioball down and back down hallway 2 x20', 2 x20' (w/ 3# AW on proximal wrists) Standing shoulder flexion+ isometric ER at wall w/ GTB (proximal to wrists) 2 x10  Wall angels w/ 4# DB 2 x8 Supine serratus punches w/ 6# DB in bilat UE 's 3x10 "Y" raises on physioball in chair with 4# DB "T" raises on  physioball  in chair with 4# DB Medicine ball overhead throws to ground w/ 15# medicine ball 2 x10   PATIENT EDUCATION: Education details: HEP given to pt  Person educated: Patient Education method: Medical illustrator Education comprehension: verbalized understanding and returned demonstration   HOME EXERCISE PROGRAM: Access Code: [email or web address] URL: [email or web address] Date: 08/01/2023 Prepared by: Ronnie Derby   Exercises - Standing Isometric Shoulder Internal Rotation at Doorway  - 1 x daily - 7 x weekly - 10 sets - 10 hold - Seated Scapular Retraction  - 1 x daily - 7 x weekly - 2 sets - 10 reps - Prone Shoulder Horizontal Abduction  - 1 x daily - 7 x weekly - 2 sets - 8 reps - Prone Single Arm Shoulder Y  - 1 x daily - 7 x weekly - 2 sets - 8 reps   ASSESSMENT:   CLINICAL IMPRESSION: Session focused on progressing L shoulder and bilat peri-scapular strengthening exercises. Pt continues to note improvements in L shoulder strength and mobility noted w/ increased activity tolerance with exercises at today's session. Pt noting increased difficulty w/ L shoulder abduction full AROM w/ 5# DB needing to decrease to 4# DB for proper form throughout exercise. Pt continues to have pain in the L shoulder w/ overhead activities and would continue to benefit from skilled PT interventions to address remaining strength and pain deficits and improve QoL to return to PLOF.   OBJECTIVE IMPAIRMENTS: decreased ROM, decreased strength, and impaired UE functional use.    ACTIVITY LIMITATIONS: carrying, lifting, and reach over head   PARTICIPATION LIMITATIONS: cleaning, driving, and community activity   PERSONAL FACTORS: Age, Fitness, and Time since onset of injury/illness/exacerbation are also affecting patient's functional outcome.    REHAB POTENTIAL: Good   CLINICAL DECISION MAKING: Stable/uncomplicated   EVALUATION COMPLEXITY: Low     GOALS: Goals reviewed with  patient? Yes   SHORT TERM GOALS: Target date: 08/21/23   Pt will be independent with HEP to improve L shoulder strength and decrease pain with functional activities  Baseline: 07/31/23: HEP given to pt Goal status: INITIAL  LONG TERM GOALS: Target date: 09/11/23   Pt will improve FOTO to target score to demonstrate clinically significant improvement in functional mobility.  Baseline: 07/31/23: 69/72 Goal status: INITIAL   2.  Pt will decrease pain to <2/10 to demonstrate clinically significant improvement in pain levels with functional activity and ADL's.  Baseline: 07/31/23: 4/10 Goal status: INITIAL   3.  Pt will self report the ability to utilize her LUE to drive >40 min, w/ <9/81 pain, to demonstrate improvements with ability to complete work related activities.  Baseline: 07/31/23: Cannot drive w/ LUE for >19 minutes w/o 4/10 pain.  Goal status: INITIAL   PLAN:   PT FREQUENCY: 2x/week   PT DURATION: 6 weeks   PLANNED INTERVENTIONS: Therapeutic exercises, Therapeutic activity, Neuromuscular re-education, Balance training, Gait training, Patient/Family education, Self Care, Joint mobilization, Joint manipulation, Dry Needling, Electrical stimulation, Spinal manipulation, Spinal mobilization, Cryotherapy, Moist heat, Traction, and Manual therapy   PLAN FOR NEXT SESSION: Update pt HEP. Reassess tolerance to additional exercises added at last visit.    Lovie Macadamia, SPT  Delphia Grates. Fairly IV, PT, DPT Physical Therapist- [place]  [facility]  08/28/2023, 4:48 PM

## 2023-09-01 ENCOUNTER — Ambulatory Visit: Payer: BC Managed Care – PPO

## 2023-09-01 DIAGNOSIS — G8929 Other chronic pain: Secondary | ICD-10-CM | POA: Diagnosis not present

## 2023-09-01 DIAGNOSIS — M25512 Pain in left shoulder: Secondary | ICD-10-CM | POA: Diagnosis not present

## 2023-09-01 DIAGNOSIS — M6281 Muscle weakness (generalized): Secondary | ICD-10-CM | POA: Diagnosis not present

## 2023-09-01 NOTE — Therapy (Addendum)
OUTPATIENT PHYSICAL THERAPY SHOULDER TREATMENT     Patient Name: Margaret Brewer MRN: [id number] DOB:14-Oct-1966, 57 y.o., female Today's Date: 08/04/2023   END OF SESSION:   Margaret Brewer End of Session - 08/04/23 1011       Visit Number 8    Number of Visits 13     Date for Margaret Brewer Re-Evaluation 09/11/23     Margaret Brewer Start Time 0814    Margaret Brewer Stop Time 0857    Margaret Brewer Time Calculation (min) 43 min     Activity Tolerance Patient tolerated treatment well     Behavior During Therapy [facility] for tasks assessed/performed                         Past Medical History:  Diagnosis Date   Hyperlipidemia      with high HDL   Metatarsal fracture     Migraine without aura      varies with hormonal changes and weather changes             Past Surgical History:  Procedure Laterality Date   CHOLECYSTECTOMY   01/14/2002    Dr. Evette Cristal   ESOPHAGOGASTRODUODENOSCOPY (EGD) WITH PROPOFOL N/A 07/07/2015    Procedure: ESOPHAGOGASTRODUODENOSCOPY (EGD) WITH PROPOFOL;  Surgeon: Christena Deem, MD;  Location: [facility] ENDOSCOPY;  Service: Endoscopy;  Laterality: N/A;   OOPHORECTOMY   04/1986    Ovarian cyst, unilateral oophorectomy   TONSILLECTOMY        As a child            Patient Active Problem List    Diagnosis Date Noted   Hand paresthesia 07/27/2023   Vertigo 06/11/2023   Chest wall pain 05/14/2023   Right wrist pain 09/03/2022   Left foot pain 07/16/2020   Advance care planning 03/09/2015   Routine general medical examination at a health care facility 02/13/2012   Shoulder pain 02/13/2012   Migraine without aura 12/29/2007      PCP: Joaquim Nam, MD   REFERRING PROVIDER: Joaquim Nam, MD   REFERRING DIAG: M25.519 (ICD-10-CM) - Shoulder pain, unspecified chronicity, unspecified laterality   THERAPY DIAG:  Chronic left shoulder pain   Muscle weakness (generalized)   Rationale for Evaluation and Treatment: Rehabilitation   ONSET DATE: Multiple years, but most recent flare up 3 months  ago    SUBJECTIVE:                                                                                                                                                                                       SUBJECTIVE STATEMENT:  Margaret Brewer reports 1-2/10 NPS in the L shoulder at today's session. No notable changes over the weekend.    Hand dominance: Right   PERTINENT HISTORY: Margaret Brewer is a 57 y/o F w/ chronic L shoulder pain for many years. Margaret Brewer's most recent flare up occurred with insidious onset when she was sitting at her desk at work and felt a sharp, shooting pain down the front of her Lt shoulder. Margaret Brewer reports difficulty with driving >16 minutes, overhead movements and ADL's such as putting on her bra and lifting activities. Margaret Brewer's sx are relieved by ceasing movement, and icing her L shoulder. Margaret Brewer also reports having intense migraines that are currently being managed by chiropractic services. Margaret Brewer has been dx with carpal tunnel in bilat hands L >R as well, and has been wearing a brace to treat this condition. She addresses concerns of pain in the anterior shoulder near the biceps tendon, and clicking and popping in the L shoulder with movement. Along with, difficulty sleeping on her left side.  However, there are no concerns expressed of N/T down the L arm.    PAIN:  Are you having pain? Yes: NPRS scale: 1-2/10 Pain location: Back of the L shoulder, into the front of the L shoulder Pain description: Dull and achy,  Aggravating factors: Overhead movements, lifting activities Relieving factors: Ceasing movements, icing, chiropractor  Least pain: 2/10 sitting, Worst pain: 4/10 with activity  No significant change in physical activity to contribute to recent flare up.     PRECAUTIONS: None   RED FLAGS: None      WEIGHT BEARING RESTRICTIONS: No   FALLS:  Has patient fallen in last 6 months? 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    UPPER EXTREMITY ROM: All AROM grossly WFL, mild deficits w/ L shoulder IR AROM.    UPPER EXTREMITY MMT:   MMT Right eval Left eval  Shoulder flexion 4+ 4  Shoulder extension 4+ 4-  Shoulder abduction 4+ 4-  Shoulder adduction      Shoulder internal rotation 4+ 4-  Shoulder external rotation 4+ 4  Middle trapezius 4- 3+  Lower trapezius 4- 3+  Elbow flexion 4+ 4+  Elbow extension      Wrist flexion      Wrist extension      Wrist ulnar deviation      Wrist radial deviation      Wrist pronation      Wrist supination      Grip strength (lbs) 5 4+  (Blank rows = not tested)   CERVICAL SPECIAL TESTS: Spurling's: Negative    SHOULDER SPECIAL TESTS: Impingement tests: Hawkins/Kennedy impingement test: positive  Rotator cuff assessment: Drop arm test: negative, Empty can test: negative, and Full can test: positive    JOINT MOBILITY TESTING:  No notable hypomobility noted in L shoulder w/ A<>P and P <> A joint mobilizations.    PALPATION:  To be assessed at next visit.  TODAY'S TREATMENT:                                                                                                                                         DATE: 09/01/23   UBE x2 min forward/ x2 min backward lvl 3.0  Standing shoulder flexion+ isometric ER at wall w/ Blue TB (proximal to wrists) 2 x10  Standing shoulder horizontal abduction w/ blue TB 2 x10  Upside down BOSU ball stabilizations in plank position 1 x30sec, lateral weight shifts 2 x30 sec.  Supine serratus punches w/ 8# DB in bilat UE 's 3x10 Standing bicep curls w/ 8# DB x10, w/ slow eccentric lowering Standing L shoulder IR walk outs w/ blue TB x  7 down and back Standing L shoulder ER walk outs w/ blue TB x 7 down and back  L shoulder "Y" raises on physioball in prone with 3# DB L shoulder "T" raises on physioball  in prone with 3# DB Wall angels w/ 4# DB 2 x12   PATIENT EDUCATION: Education details: HEP given to Margaret Brewer  Person educated: Patient Education method: Medical illustrator Education comprehension: verbalized understanding and returned demonstration   HOME EXERCISE PROGRAM: Access Code: [email or web address] URL: [email or web address] Date: 08/01/2023 Prepared by: Ronnie Derby   Exercises - Standing Isometric Shoulder Internal Rotation at Doorway  - 1 x daily - 7 x weekly - 10 sets - 10 hold - Seated Scapular Retraction  - 1 x daily - 7 x weekly - 2 sets - 10 reps - Prone Shoulder Horizontal Abduction  - 1 x daily - 7 x weekly - 2 sets - 8 reps - Prone Single Arm Shoulder Y  - 1 x daily - 7 x weekly - 2 sets - 8 reps   ASSESSMENT:   CLINICAL IMPRESSION: Session focused on progressing L shoulder and bilat peri-scapular strengthening exercises along with updating Margaret Brewer's HEP. Margaret Brewer continues to note improvements in L shoulder strength and mobility noted w/ increased activity tolerance with exercises at today's session. Margaret Brewer's overall pain levels continue to decrease, however Margaret Brewer does note an increase in pain w/ L shoulder IR. Margaret Brewer's HEP was updated to progress L shoulder exercises and improve overhead activity tolerance. Reviewed and educated Margaret Brewer on importance of rest and recovery. Margaret Brewer would continue to benefit from skilled Margaret Brewer interventions to address remaining strength and pain deficits and improve QoL to return to PLOF.   OBJECTIVE IMPAIRMENTS: decreased ROM, decreased strength, and impaired UE functional use.    ACTIVITY LIMITATIONS: carrying, lifting, and reach over head   PARTICIPATION LIMITATIONS: cleaning, driving, and community activity   PERSONAL FACTORS: Age, Fitness, and Time since onset of  injury/illness/exacerbation are also affecting patient's functional outcome.    REHAB POTENTIAL: Good   CLINICAL DECISION MAKING: Stable/uncomplicated   EVALUATION COMPLEXITY: Low     GOALS: Goals reviewed with patient? Yes   SHORT TERM GOALS: Target  date: 08/21/23   Margaret Brewer will be independent with HEP to improve L shoulder strength and decrease pain with functional activities  Baseline: 07/31/23: HEP given to Margaret Brewer Goal status: INITIAL   LONG TERM GOALS: Target date: 09/11/23   Margaret Brewer will improve FOTO to target score to demonstrate clinically significant improvement in functional mobility.  Baseline: 07/31/23: 69/72 Goal status: INITIAL   2.  Margaret Brewer will decrease pain to <2/10 to demonstrate clinically significant improvement in pain levels with functional activity and ADL's.  Baseline: 07/31/23: 4/10 Goal status: INITIAL   3.  Margaret Brewer will self report the ability to utilize her LUE to drive >40 min, w/ <9/81 pain, to demonstrate improvements with ability to complete work related activities.  Baseline: 07/31/23: Cannot drive w/ LUE for >19 minutes w/o 4/10 pain.  Goal status: INITIAL   PLAN:   Margaret Brewer FREQUENCY: 2x/week   Margaret Brewer DURATION: 6 weeks   PLANNED INTERVENTIONS: Therapeutic exercises, Therapeutic activity, Neuromuscular re-education, Balance training, Gait training, Patient/Family education, Self Care, Joint mobilization, Joint manipulation, Dry Needling, Electrical stimulation, Spinal manipulation, Spinal mobilization, Cryotherapy, Moist heat, Traction, and Manual therapy   PLAN FOR NEXT SESSION: Reassess tolerance to additional exercises added to HEP. Continue to progress L shoulder strengthening (IR emphasis). Bicep supination.      Lovie Macadamia, SPT  Delphia Grates. Fairly IV, Margaret Brewer, DPT Physical Therapist- [place]  [facility]  09/01/2023, 10:33 AM

## 2023-09-03 ENCOUNTER — Ambulatory Visit: Payer: BC Managed Care – PPO

## 2023-09-03 DIAGNOSIS — G8929 Other chronic pain: Secondary | ICD-10-CM | POA: Diagnosis not present

## 2023-09-03 DIAGNOSIS — M25512 Pain in left shoulder: Secondary | ICD-10-CM | POA: Diagnosis not present

## 2023-09-03 DIAGNOSIS — M6281 Muscle weakness (generalized): Secondary | ICD-10-CM

## 2023-09-03 NOTE — Therapy (Cosign Needed Addendum)
OUTPATIENT PHYSICAL THERAPY SHOULDER TREATMENT     Patient Name: Margaret Brewer MRN: [id number] DOB:02-26-1966, 57 y.o., female Today's Date: 08/04/2023   END OF SESSION:   PT End of Session - 08/04/23 1011       Visit Number 9    Number of Visits 13     Date for PT Re-Evaluation 09/11/23     PT Start Time 1603    PT Stop Time 1645    PT Time Calculation (min) 42 min     Activity Tolerance Patient tolerated treatment well     Behavior During Therapy WFL for tasks assessed/performed                         Past Medical History:  Diagnosis Date   Hyperlipidemia      with high HDL   Metatarsal fracture     Migraine without aura      varies with hormonal changes and weather changes             Past Surgical History:  Procedure Laterality Date   CHOLECYSTECTOMY   01/14/2002    Dr. Evette Cristal   ESOPHAGOGASTRODUODENOSCOPY (EGD) WITH PROPOFOL N/A 07/07/2015    Procedure: ESOPHAGOGASTRODUODENOSCOPY (EGD) WITH PROPOFOL;  Surgeon: Christena Deem, MD;  Location: [facility] ENDOSCOPY;  Service: Endoscopy;  Laterality: N/A;   OOPHORECTOMY   04/1986    Ovarian cyst, unilateral oophorectomy   TONSILLECTOMY        As a child            Patient Active Problem List    Diagnosis Date Noted   Hand paresthesia 07/27/2023   Vertigo 06/11/2023   Chest wall pain 05/14/2023   Right wrist pain 09/03/2022   Left foot pain 07/16/2020   Advance care planning 03/09/2015   Routine general medical examination at a health care facility 02/13/2012   Shoulder pain 02/13/2012   Migraine without aura 12/29/2007      PCP: Joaquim Nam, MD   REFERRING PROVIDER: Joaquim Nam, MD   REFERRING DIAG: M25.519 (ICD-10-CM) - Shoulder pain, unspecified chronicity, unspecified laterality   THERAPY DIAG:  Chronic left shoulder pain   Muscle weakness (generalized)   Rationale for Evaluation and Treatment: Rehabilitation   ONSET DATE: Multiple years, but most recent flare up 3 months  ago    SUBJECTIVE:                                                                                                                                                                                       SUBJECTIVE STATEMENT:  Pt reports 2-3/10 NPS in the L shoulder at today's session. Pt notes mild soreness in the L shoulder after last session, and iced her shoulder at work but no other notable changes since last session.    Hand dominance: Right   PERTINENT HISTORY: Pt is a 57 y/o F w/ chronic L shoulder pain for many years. Pt's most recent flare up occurred with insidious onset when she was sitting at her desk at work and felt a sharp, shooting pain down the front of her Lt shoulder. Pt reports difficulty with driving >16 minutes, overhead movements and ADL's such as putting on her bra and lifting activities. Pt's sx are relieved by ceasing movement, and icing her L shoulder. Pt also reports having intense migraines that are currently being managed by chiropractic services. Pt has been dx with carpal tunnel in bilat hands L >R as well, and has been wearing a brace to treat this condition. She addresses concerns of pain in the anterior shoulder near the biceps tendon, and clicking and popping in the L shoulder with movement. Along with, difficulty sleeping on her left side.  However, there are no concerns expressed of N/T down the L arm.    PAIN:  Are you having pain? Yes: NPRS scale: 1-2/10 Pain location: Back of the L shoulder, into the front of the L shoulder Pain description: Dull and achy,  Aggravating factors: Overhead movements, lifting activities Relieving factors: Ceasing movements, icing, chiropractor  Least pain: 2/10 sitting, Worst pain: 4/10 with activity  No significant change in physical activity to contribute to recent flare up.     PRECAUTIONS: None   RED FLAGS: None      WEIGHT BEARING RESTRICTIONS: No   FALLS:  Has patient fallen in last 6 months? 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    UPPER EXTREMITY ROM: All AROM grossly WFL, mild deficits w/ L shoulder IR AROM.    UPPER EXTREMITY MMT:   MMT Right eval Left eval  Shoulder flexion 4+ 4  Shoulder extension 4+ 4-  Shoulder abduction 4+ 4-  Shoulder adduction      Shoulder internal rotation 4+ 4-  Shoulder external rotation 4+ 4  Middle trapezius 4- 3+  Lower trapezius 4- 3+  Elbow flexion 4+ 4+  Elbow extension      Wrist flexion      Wrist extension      Wrist ulnar deviation      Wrist radial deviation      Wrist pronation      Wrist supination      Grip strength (lbs) 5 4+  (Blank rows = not tested)   CERVICAL SPECIAL TESTS: Spurling's: Negative    SHOULDER SPECIAL TESTS: Impingement tests: Hawkins/Kennedy impingement test: positive  Rotator cuff assessment: Drop arm test: negative, Empty can test: negative, and Full can test: positive    JOINT MOBILITY TESTING:  No notable hypomobility noted in L shoulder w/ A<>P  and P <> A joint mobilizations.    PALPATION:  To be assessed at next visit.              TODAY'S TREATMENT:                                                                                                                                         DATE: 09/04/23   UBE x2 min forward/ x2 min backward lvl 4.0  Standing shoulder flexion+ isometric ER at wall w/ Blue TB (proximal to wrists) 2 x8 Standing L shoulder flexion w/ blue TB 3 x8  Standing L shoulder abduction w/ blue TB 3 x8  Standing L shoulder 10# DB press w/ internal rotation at end range 2 x10 Standing bicep curls w/ pronation/ supination  w/ bilat  Ue's #8, 2 x10  Standing L shoulder IR walk outs w/ blue TB x 8 down and back Standing L shoulder ER walk outs w/ blue TB x 8 down and back  Supine serratus punches w/ 8# DB in bilat UE 's 3x10  L shoulder "Y" raises on physioball in prone with 3# DB 3 x10 L shoulder "T" raises on physioball  in prone with 3# DB 3 x10  PATIENT EDUCATION: Education details: HEP given to pt  Person educated: Patient Education method: Medical illustrator Education comprehension: verbalized understanding and returned demonstration   HOME EXERCISE PROGRAM: Access Code: [email or web address] URL: [email or web address] Date: 08/01/2023 Prepared by: Ronnie Derby   Exercises - Standing Isometric Shoulder Internal Rotation at Doorway  - 1 x daily - 7 x weekly - 10 sets - 10 hold - Seated Scapular Retraction  - 1 x daily - 7 x weekly - 2 sets - 10 reps - Prone Shoulder Horizontal Abduction  - 1 x daily - 7 x weekly - 2 sets - 8 reps - Prone Single Arm Shoulder Y  - 1 x daily - 7 x weekly - 2 sets - 8 reps   ASSESSMENT:   CLINICAL IMPRESSION: Session focused on progressing L shoulder and bilat peri-scapular strengthening exercises. Pt continues to note improvements in L shoulder strength and mobility noted w/ increased activity tolerance with exercises at today's session. Pt notes pain at biceps tendon/ anterior shoulder displayed during overhead exercises. Future sessions will emphasize anterior shoulder activity to promote strengthening in this region. Pt would continue to benefit from skilled PT interventions to address remaining strength and pain deficits and improve QoL to return to PLOF.   OBJECTIVE IMPAIRMENTS: decreased ROM, decreased strength, and impaired UE functional use.    ACTIVITY LIMITATIONS: carrying, lifting, and reach over head   PARTICIPATION LIMITATIONS: cleaning, driving, and community activity   PERSONAL FACTORS: Age, Fitness, and Time since onset of  injury/illness/exacerbation are also affecting patient's functional outcome.    REHAB POTENTIAL: Good   CLINICAL DECISION MAKING: Stable/uncomplicated   EVALUATION COMPLEXITY: Low  GOALS: Goals reviewed with patient? Yes   SHORT TERM GOALS: Target date: 08/21/23   Pt will be independent with HEP to improve L shoulder strength and decrease pain with functional activities  Baseline: 07/31/23: HEP given to pt Goal status: INITIAL   LONG TERM GOALS: Target date: 09/11/23   Pt will improve FOTO to target score to demonstrate clinically significant improvement in functional mobility.  Baseline: 07/31/23: 69/72 Goal status: INITIAL   2.  Pt will decrease pain to <2/10 to demonstrate clinically significant improvement in pain levels with functional activity and ADL's.  Baseline: 07/31/23: 4/10 Goal status: INITIAL   3.  Pt will self report the ability to utilize her LUE to drive >16 min, w/ <1/09 pain, to demonstrate improvements with ability to complete work related activities.  Baseline: 07/31/23: Cannot drive w/ LUE for >60 minutes w/o 4/10 pain.  Goal status: INITIAL   PLAN:   PT FREQUENCY: 2x/week   PT DURATION: 6 weeks   PLANNED INTERVENTIONS: Therapeutic exercises, Therapeutic activity, Neuromuscular re-education, Balance training, Gait training, Patient/Family education, Self Care, Joint mobilization, Joint manipulation, Dry Needling, Electrical stimulation, Spinal manipulation, Spinal mobilization, Cryotherapy, Moist heat, Traction, and Manual therapy   PLAN FOR NEXT SESSION: PROGRESS NOTE Continue to progress L shoulder strengthening (IR emphasis). Bicep supination. Anterior L shoulder strengthening.   Lovie Macadamia, SPT  Delphia Grates. Fairly IV, PT, DPT Physical Therapist- [facility]  09/04/2023, 8:17 AM

## 2023-09-08 DIAGNOSIS — M542 Cervicalgia: Secondary | ICD-10-CM | POA: Diagnosis not present

## 2023-09-08 DIAGNOSIS — G43719 Chronic migraine without aura, intractable, without status migrainosus: Secondary | ICD-10-CM | POA: Diagnosis not present

## 2023-09-09 ENCOUNTER — Ambulatory Visit: Payer: BC Managed Care – PPO

## 2023-09-09 DIAGNOSIS — G8929 Other chronic pain: Secondary | ICD-10-CM | POA: Diagnosis not present

## 2023-09-09 DIAGNOSIS — M6281 Muscle weakness (generalized): Secondary | ICD-10-CM

## 2023-09-09 DIAGNOSIS — M25512 Pain in left shoulder: Secondary | ICD-10-CM | POA: Diagnosis not present

## 2023-09-09 NOTE — Therapy (Cosign Needed)
OUTPATIENT PHYSICAL THERAPY SHOULDER TREATMENT/ PROGRESS NOTE/RECERT Dates of reporting period: 07/31/23-09/09/23     Patient Name: Margaret Brewer MRN: [id number] DOB:1966/07/03, 57 y.o., female Today's Date: 08/04/2023   END OF SESSION:   PT End of Session - 08/04/23 1011       Visit Number 10    Number of Visits 21     Date for PT Re-Evaluation 10/07/2023    PT Start Time 1518    PT Stop Time 1600    PT Time Calculation (min) 42 min     Activity Tolerance Patient tolerated treatment well     Behavior During Therapy [facility] for tasks assessed/performed                         Past Medical History:  Diagnosis Date   Hyperlipidemia      with high HDL   Metatarsal fracture     Migraine without aura      varies with hormonal changes and weather changes             Past Surgical History:  Procedure Laterality Date   CHOLECYSTECTOMY   01/14/2002    Dr. Evette Cristal   ESOPHAGOGASTRODUODENOSCOPY (EGD) WITH PROPOFOL N/A 07/07/2015    Procedure: ESOPHAGOGASTRODUODENOSCOPY (EGD) WITH PROPOFOL;  Surgeon: Christena Deem, MD;  Location: [facility] ENDOSCOPY;  Service: Endoscopy;  Laterality: N/A;   OOPHORECTOMY   04/1986    Ovarian cyst, unilateral oophorectomy   TONSILLECTOMY        As a child            Patient Active Problem List    Diagnosis Date Noted   Hand paresthesia 07/27/2023   Vertigo 06/11/2023   Chest wall pain 05/14/2023   Right wrist pain 09/03/2022   Left foot pain 07/16/2020   Advance care planning 03/09/2015   Routine general medical examination at a health care facility 02/13/2012   Shoulder pain 02/13/2012   Migraine without aura 12/29/2007      PCP: Joaquim Nam, MD   REFERRING PROVIDER: Joaquim Nam, MD   REFERRING DIAG: M25.519 (ICD-10-CM) - Shoulder pain, unspecified chronicity, unspecified laterality   THERAPY DIAG:  Chronic left shoulder pain   Muscle weakness (generalized)   Rationale for Evaluation and Treatment:  Rehabilitation   ONSET DATE: Multiple years, but most recent flare up 3 months ago    SUBJECTIVE:                                                                                                                                                                                       SUBJECTIVE STATEMENT:  Pt reports 1-2/10 NPS in the L shoulder today. Noting increased pain in the lateral L shoulder over the past few days.    Hand dominance: Right   PERTINENT HISTORY: Pt is a 57 y/o F w/ chronic L shoulder pain for many years. Pt's most recent flare up occurred with insidious onset when she was sitting at her desk at work and felt a sharp, shooting pain down the front of her Lt shoulder. Pt reports difficulty with driving >59 minutes, overhead movements and ADL's such as putting on her bra and lifting activities. Pt's sx are relieved by ceasing movement, and icing her L shoulder. Pt also reports having intense migraines that are currently being managed by chiropractic services. Pt has been dx with carpal tunnel in bilat hands L >R as well, and has been wearing a brace to treat this condition. She addresses concerns of pain in the anterior shoulder near the biceps tendon, and clicking and popping in the L shoulder with movement. Along with, difficulty sleeping on her left side.  However, there are no concerns expressed of N/T down the L arm.    PAIN:  Are you having pain? Yes: NPRS scale: 1-2/10 Pain location: Back of the L shoulder, into the front of the L shoulder Pain description: Dull and achy,  Aggravating factors: Overhead movements, lifting activities Relieving factors: Ceasing movements, icing, chiropractor  Least pain: 2/10 sitting, Worst pain: 4/10 with activity  No significant change in physical activity to contribute to recent flare up.     PRECAUTIONS: None   RED FLAGS: None      WEIGHT BEARING RESTRICTIONS: No   FALLS:  Has patient fallen in last 6 months? 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    UPPER EXTREMITY ROM: All AROM grossly WFL, mild deficits w/ L shoulder IR AROM.    UPPER EXTREMITY MMT:   MMT Right eval Left eval  Shoulder flexion 4+ 4  Shoulder extension 4+ 4-  Shoulder abduction 4+ 4-  Shoulder adduction      Shoulder internal rotation 4+ 4-  Shoulder external rotation 4+ 4  Middle trapezius 4- 3+  Lower trapezius 4- 3+  Elbow flexion 4+ 4+  Elbow extension      Wrist flexion      Wrist extension      Wrist ulnar deviation      Wrist radial deviation      Wrist pronation      Wrist supination      Grip strength (lbs) 5 4+  (Blank rows = not tested)   CERVICAL SPECIAL TESTS: Spurling's: Negative    SHOULDER SPECIAL TESTS: Impingement tests: Hawkins/Kennedy impingement test: positive  Rotator cuff assessment: Drop arm test: negative, Empty can test: negative, and Full can test: positive    JOINT MOBILITY TESTING:  No notable hypomobility noted in L shoulder w/ A<>P and P <> A joint mobilizations.    PALPATION:  To be assessed  at next visit.              TODAY'S TREATMENT:                                                                                                                                         DATE: 09/09/23  Beginning of session spent reviewing goals a pt requiring progress note. See clinical impression and goals section for details.   ULTT on LUE:  Median nerve: concordant sx  Radial nerve: negative  Ulnar nerve: negative   Pec stretch 3 x10  Standing shoulder lateral raises w/ 4# DB in bilat Ue's 2 x8  Educated on updated HEP to include  median nerve flosses (frequency, reps, sets)  PATIENT EDUCATION: Education details: HEP given to pt  Person educated: Patient Education method: Medical illustrator Education comprehension: verbalized understanding and returned demonstration   HOME EXERCISE PROGRAM: Access Code: [email or web address] URL: [email or web address] Date: 08/01/2023 Prepared by: Ronnie Derby   Exercises - Standing Isometric Shoulder Internal Rotation at Doorway  - 1 x daily - 7 x weekly - 10 sets - 10 hold - Seated Scapular Retraction  - 1 x daily - 7 x weekly - 2 sets - 10 reps - Prone Shoulder Horizontal Abduction  - 1 x daily - 7 x weekly - 2 sets - 8 reps - Prone Single Arm Shoulder Y  - 1 x daily - 7 x weekly - 2 sets - 8 reps   ASSESSMENT:   CLINICAL IMPRESSION:  Session focused on reassessing pt's STG and LTG's as pt requires a progress note/recert for her 10th visit. Pt shows progression in PT noted w/ achieving 4/4 of her goals including, exceeding her FOTO score, decreasing her pain, being HEP compliant, and increasing her ability to drive using her LUE pain- free. Pt continues to express concerns regarding L shoulder pain w/ N/T in the lateral aspect of her shoulder that travels down to her L hand. ULTT were administered today, noting concordant N/T sx w/ median nerve testing. Pt's HEP was updated to include a median nerve glide to assist in reducing N/T sx of the LUE. Pt would continue to benefit from 2x/ week for 4 additional weeks of skilled PT interventions to address remaining strength and pain deficits and improve QoL to return to PLOF.  OBJECTIVE IMPAIRMENTS: decreased ROM, decreased strength, and impaired UE functional use.    ACTIVITY LIMITATIONS: carrying, lifting, and reach over head   PARTICIPATION LIMITATIONS: cleaning, driving, and community activity   PERSONAL FACTORS: Age, Fitness, and Time since onset of injury/illness/exacerbation are also affecting patient's  functional outcome.    REHAB POTENTIAL: Good   CLINICAL DECISION MAKING: Stable/uncomplicated   EVALUATION COMPLEXITY: Low     GOALS: Goals reviewed with patient? Yes   SHORT TERM GOALS: Target date: 08/21/23   Pt will be independent with HEP to improve L shoulder strength and  decrease pain with functional activities  Baseline: 07/31/23: HEP given to pt 09/09/23: HEP compliance Goal status: MET   LONG TERM GOALS: Target date: 10/07/23   Pt will improve FOTO to target score to demonstrate clinically significant improvement in functional mobility.  Baseline: 07/31/23: 69/72 09/09/23: 75/72 Goal status: MET   2.  Pt will decrease pain to <2/10 to demonstrate clinically significant improvement in pain levels with functional activity and ADL's.  Baseline: 07/31/23: 4/10 09/09/23: 1-2/10  Goal status: MET   3.  Pt will self report the ability to utilize her LUE to drive >21 min, w/ <3/08 pain, to demonstrate improvements with ability to complete work related activities.  Baseline: 07/31/23: Cannot drive w/ LUE for >65 minutes w/o 4/10 pain. 09/09/23: Able to drive >78 minutes w/o pain  Goal status: MET  4. Pt will self report the ability to sleep w/ her left arm positioned to pt comfort, w/o N/T sx, to demonstrate improvements with ability to improve sleep quality.  Baseline: 09/09/23: N/T with LUE positioned underneath pillow preventing sleeping in that position Goal status: INITIAL   PLAN:   PT FREQUENCY: 2x/week   PT DURATION: 4 weeks   PLANNED INTERVENTIONS: Therapeutic exercises, Therapeutic activity, Neuromuscular re-education, Balance training, Gait training, Patient/Family education, Self Care, Joint mobilization, Joint manipulation, Dry Needling, Electrical stimulation, Spinal manipulation, Spinal mobilization, Cryotherapy, Moist heat, Traction, and Manual therapy   PLAN FOR NEXT SESSION: Reassess nerve glide tolerance, Lateral shoulder strengthening exercises   Lovie Macadamia, SPT  Delphia Grates. Fairly IV, PT, DPT Physical Therapist-   [facility]  09/10/2023, 8:51 AM

## 2023-09-10 NOTE — Progress Notes (Signed)
Physician Signature: Joaquim Nam, MD  Date:_09/25/24 _ Time:___12:29 PM

## 2023-09-11 ENCOUNTER — Ambulatory Visit: Payer: BC Managed Care – PPO

## 2023-09-11 DIAGNOSIS — M6281 Muscle weakness (generalized): Secondary | ICD-10-CM | POA: Diagnosis not present

## 2023-09-11 DIAGNOSIS — G8929 Other chronic pain: Secondary | ICD-10-CM | POA: Diagnosis not present

## 2023-09-11 DIAGNOSIS — M25512 Pain in left shoulder: Secondary | ICD-10-CM | POA: Diagnosis not present

## 2023-09-16 ENCOUNTER — Ambulatory Visit: Payer: BC Managed Care – PPO | Attending: Family Medicine

## 2023-09-16 DIAGNOSIS — M25512 Pain in left shoulder: Secondary | ICD-10-CM | POA: Insufficient documentation

## 2023-09-16 DIAGNOSIS — G8929 Other chronic pain: Secondary | ICD-10-CM | POA: Diagnosis not present

## 2023-09-16 DIAGNOSIS — M6281 Muscle weakness (generalized): Secondary | ICD-10-CM | POA: Insufficient documentation

## 2023-09-16 NOTE — Therapy (Addendum)
OUTPATIENT PHYSICAL THERAPY SHOULDER TREATMENT     Patient Name: Margaret Brewer MRN: [id number] DOB:[birth year], 57 y.o., female Today's Date: 08/04/2023   END OF SESSION: PT End of Session - 08/04/23 1011       Visit Number 12    Number of Visits 21     Date for PT Re-Evaluation 10/07/2023    PT Start Time 1603    PT Stop Time 1645    PT Time Calculation (min) 42 min     Activity Tolerance Patient tolerated treatment well     Behavior During Therapy [facility] for tasks assessed/performed                         Past Medical History:  Diagnosis Date   Hyperlipidemia      with high HDL   Metatarsal fracture     Migraine without aura      varies with hormonal changes and weather changes             Past Surgical History:  Procedure Laterality Date   CHOLECYSTECTOMY   01/14/2002    Dr. Evette Cristal   ESOPHAGOGASTRODUODENOSCOPY (EGD) WITH PROPOFOL N/A 07/07/2015    Procedure: ESOPHAGOGASTRODUODENOSCOPY (EGD) WITH PROPOFOL;  Surgeon: Christena Deem, MD;  Location: [facility] ENDOSCOPY;  Service: Endoscopy;  Laterality: N/A;   OOPHORECTOMY   04/1986    Ovarian cyst, unilateral oophorectomy   TONSILLECTOMY        As a child            Patient Active Problem List    Diagnosis Date Noted   Hand paresthesia 07/27/2023   Vertigo 06/11/2023   Chest wall pain 05/14/2023   Right wrist pain 09/03/2022   Left foot pain 07/16/2020   Advance care planning 03/09/2015   Routine general medical examination at a health care facility 02/13/2012   Shoulder pain 02/13/2012   Migraine without aura 12/29/2007      PCP: Joaquim Nam, MD   REFERRING PROVIDER: Joaquim Nam, MD   REFERRING DIAG: M25.519 (ICD-10-CM) - Shoulder pain, unspecified chronicity, unspecified laterality   THERAPY DIAG:  Chronic left shoulder pain   Muscle weakness (generalized)   Rationale for Evaluation and Treatment: Rehabilitation   ONSET DATE: Multiple years, but most recent flare up 3 months  ago    SUBJECTIVE:                                                                                                                                                                                       SUBJECTIVE STATEMENT:  Pt reports 1-2/10NPS in the front of her shoulder at  today's session. No notable changes since last session and no increase in soreness  after last visit.   Hand dominance: Right   PERTINENT HISTORY: Pt is a 57 y/o F w/ chronic L shoulder pain for many years. Pt's most recent flare up occurred with insidious onset when she was sitting at her desk at work and felt a sharp, shooting pain down the front of her Lt shoulder. Pt reports difficulty with driving >81 minutes, overhead movements and ADL's such as putting on her bra and lifting activities. Pt's sx are relieved by ceasing movement, and icing her L shoulder. Pt also reports having intense migraines that are currently being managed by chiropractic services. Pt has been dx with carpal tunnel in bilat hands L >R as well, and has been wearing a brace to treat this condition. She addresses concerns of pain in the anterior shoulder near the biceps tendon, and clicking and popping in the L shoulder with movement. Along with, difficulty sleeping on her left side.  However, there are no concerns expressed of N/T down the L arm.    PAIN:  Are you having pain? Yes: NPRS scale: 1-2/10 Pain location: Back of the L shoulder, into the front of the L shoulder Pain description: Dull and achy,  Aggravating factors: Overhead movements, lifting activities Relieving factors: Ceasing movements, icing, chiropractor  Least pain: 2/10 sitting, Worst pain: 4/10 with activity  No significant change in physical activity to contribute to recent flare up.     PRECAUTIONS: None   RED FLAGS: None      WEIGHT BEARING RESTRICTIONS: No   FALLS:  Has patient fallen in last 6 months? 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    UPPER EXTREMITY ROM: All AROM grossly WFL, mild deficits w/ L shoulder IR AROM.    UPPER EXTREMITY MMT:   MMT Right eval Left eval  Shoulder flexion 4+ 4  Shoulder extension 4+ 4-  Shoulder abduction 4+ 4-  Shoulder adduction      Shoulder internal rotation 4+ 4-  Shoulder external rotation 4+ 4  Middle trapezius 4- 3+  Lower trapezius 4- 3+  Elbow flexion 4+ 4+  Elbow extension      Wrist flexion      Wrist extension      Wrist ulnar deviation      Wrist radial deviation      Wrist pronation      Wrist supination      Grip strength (lbs) 5 4+  (Blank rows = not tested)   CERVICAL SPECIAL TESTS: Spurling's: Negative    SHOULDER SPECIAL TESTS: Impingement tests: Hawkins/Kennedy impingement test: positive  Rotator cuff assessment: Drop arm test: negative, Empty can test: negative, and Full can test: positive    JOINT MOBILITY TESTING:  No notable hypomobility noted in L shoulder w/ A<>P and P <> A joint mobilizations.    PALPATION:  To be assessed at next visit.  TODAY'S TREATMENT: DATE: 09/16/23  There- ex:   UBE x2 min forward, x2 min backward lvl 5.0 Standing shoulder flexion to 90deg RUE/LUE w/ 6# DB 2 x12/ each side  Standing lateral raises w/ 5# DB 2 x12/ each side  Standing shoulder lateral raises w/ 4# DB in bilat Ue's 2 x12/ each side Standing shoulder flexion+ isometric ER w/ Blue TB (proximal to wrists) 2 x12 Modified push up from elevated plinth table 3 x5   L shoulder "Y" raises on physioball with 4# DB 2 x12 L shoulder "T" raises on physioball  with 4# DB 2 x12 Standing IR at [facility] 5# 3 x8/ LUE only  Lat  pulldowns at [facility] 30# 3 x12 Bicep curls #10 RUE/LUE 2 x10    PATIENT EDUCATION: Education details: HEP given to pt  Person educated: Patient Education method: Medical illustrator Education comprehension: verbalized understanding and returned demonstration   HOME EXERCISE PROGRAM:  Access Code: [email or web address] URL: [email or web address] Date: 09/09/2023 Prepared by: Ronnie Derby  Exercises - Seated Scapular Retraction  - 1 x daily - 7 x weekly - 3 sets - 15 reps - Prone Shoulder Horizontal Abduction  - 1 x daily - 3-4 x weekly - 2 sets - 8 reps - Prone Single Arm Shoulder Y  - 1 x daily - 3-4 x weekly - 2 sets - 8 reps - Standing Bicep Curls Supinated with Dumbbells  - 1 x daily - 3-4 x weekly - 3 sets - 8 reps - Shoulder Flexion Serratus Activation with Resistance  - 1 x daily - 3-4 x weekly - 3 sets - 8 reps - Supine Scapular Protraction in Flexion with Dumbbells  - 1 x daily - 3-4 x weekly - 3 sets - 12 reps - Standing Median Nerve Glide  - 1 x daily - 7 x weekly - 3 sets - 10 reps  Access Code: [email or web address] URL: [email or web address] Date: 08/01/2023 Prepared by: Ronnie Derby   Exercises - Standing Isometric Shoulder Internal Rotation at Doorway  - 1 x daily - 7 x weekly - 10 sets - 10 hold - Seated Scapular Retraction  - 1 x daily - 7 x weekly - 2 sets - 10 reps - Prone Shoulder Horizontal Abduction  - 1 x daily - 7 x weekly - 2 sets - 8 reps - Prone Single Arm Shoulder Y  - 1 x daily - 7 x weekly - 2 sets - 8 reps   ASSESSMENT:   CLINICAL IMPRESSION:  Session focused on progressing pt's L shoulder strengthening exercise. Pt continues to note improvements in L shoulder strength displayed w/ improved activity tolerance to L shoulder exercise progressions at today's session. Pt continues to note increased lateral L shoulder pain w/ IR noted w/ IR exercise at cable column. Future sessions will continue to focus on L shoulder IR and lateral  shoulder strengthening activities. Pt would continue to benefit from skilled PT interventions to address remaining strength and pain deficits and improve QoL to return to PLOF.  OBJECTIVE IMPAIRMENTS: decreased ROM, decreased strength, and impaired UE functional use.    ACTIVITY LIMITATIONS: carrying, lifting, and reach over head   PARTICIPATION LIMITATIONS: cleaning, driving, and community activity   PERSONAL FACTORS: Age, Fitness, and Time since onset of injury/illness/exacerbation are also affecting patient's functional outcome.    REHAB POTENTIAL: Good   CLINICAL DECISION MAKING: Stable/uncomplicated   EVALUATION COMPLEXITY: Low     GOALS: Goals reviewed with patient? Yes   SHORT TERM GOALS: Target date: 08/21/23  Pt will be independent with HEP to improve L shoulder strength and decrease pain with functional activities  Baseline: 07/31/23: HEP given to pt 09/09/23: HEP compliance Goal status: MET   LONG TERM GOALS: Target date: 10/07/23   Pt will improve FOTO to target score to demonstrate clinically significant improvement in functional mobility.  Baseline: 07/31/23: 69/72 09/09/23: 75/72 Goal status: MET   2.  Pt will decrease pain to <2/10 to demonstrate clinically significant improvement in pain levels with functional activity and ADL's.  Baseline: 07/31/23: 4/10 09/09/23: 1-2/10  Goal status: MET   3.  Pt will self report the ability to utilize her LUE to drive >09 min, w/ <8/11 pain, to demonstrate improvements with ability to complete work related activities.  Baseline: 07/31/23: Cannot drive w/ LUE for >91 minutes w/o 4/10 pain. 09/09/23: Able to drive >47 minutes w/o pain  Goal status: MET  4. Pt will self report the ability to sleep w/ her left arm positioned to pt comfort, w/o N/T sx, to demonstrate improvements with ability to improve sleep quality.  Baseline: 09/09/23: N/T with LUE positioned underneath pillow preventing sleeping in that position Goal status:  INITIAL   PLAN:   PT FREQUENCY: 2x/week   PT DURATION: 4 weeks   PLANNED INTERVENTIONS: Therapeutic exercises, Therapeutic activity, Neuromuscular re-education, Balance training, Gait training, Patient/Family education, Self Care, Joint mobilization, Joint manipulation, Dry Needling, Electrical stimulation, Spinal manipulation, Spinal mobilization, Cryotherapy, Moist heat, Traction, and Manual therapy   PLAN FOR NEXT SESSION:Lateral shoulder strengthening exercises and IR strengthening exercises.   Lovie Macadamia, SPT   Nolon Bussing, PT, DPT Physical Therapist - [facility]  09/16/23, 5:28 PM

## 2023-09-18 ENCOUNTER — Ambulatory Visit: Payer: BC Managed Care – PPO

## 2023-09-18 DIAGNOSIS — G8929 Other chronic pain: Secondary | ICD-10-CM | POA: Diagnosis not present

## 2023-09-18 DIAGNOSIS — M6281 Muscle weakness (generalized): Secondary | ICD-10-CM | POA: Diagnosis not present

## 2023-09-18 DIAGNOSIS — M25512 Pain in left shoulder: Secondary | ICD-10-CM | POA: Diagnosis not present

## 2023-09-18 NOTE — Therapy (Addendum)
OUTPATIENT PHYSICAL THERAPY SHOULDER TREATMENT     Patient Name: Margaret Brewer MRN: [id number] DOB:05/17/1966, 57 y.o., female Today's Date: 08/04/2023   END OF SESSION: PT End of Session - 08/04/23 1011       Visit Number 13    Number of Visits 21     Date for PT Re-Evaluation 10/07/2023    PT Start Time 1601    PT Stop Time 1644    PT Time Calculation (min) 38 min     Activity Tolerance Patient tolerated treatment well     Behavior During Therapy [facility] for tasks assessed/performed                         Past Medical History:  Diagnosis Date   Hyperlipidemia      with high HDL   Metatarsal fracture     Migraine without aura      varies with hormonal changes and weather changes             Past Surgical History:  Procedure Laterality Date   CHOLECYSTECTOMY   01/14/2002    Dr. Evette Cristal   ESOPHAGOGASTRODUODENOSCOPY (EGD) WITH PROPOFOL N/A 07/07/2015    Procedure: ESOPHAGOGASTRODUODENOSCOPY (EGD) WITH PROPOFOL;  Surgeon: Christena Deem, MD;  Location: [facility] ENDOSCOPY;  Service: Endoscopy;  Laterality: N/A;   OOPHORECTOMY   04/1986    Ovarian cyst, unilateral oophorectomy   TONSILLECTOMY        As a child            Patient Active Problem List    Diagnosis Date Noted   Hand paresthesia 07/27/2023   Vertigo 06/11/2023   Chest wall pain 05/14/2023   Right wrist pain 09/03/2022   Left foot pain 07/16/2020   Advance care planning 03/09/2015   Routine general medical examination at a health care facility 02/13/2012   Shoulder pain 02/13/2012   Migraine without aura 12/29/2007      PCP: Joaquim Nam, MD   REFERRING PROVIDER: Joaquim Nam, MD   REFERRING DIAG: M25.519 (ICD-10-CM) - Shoulder pain, unspecified chronicity, unspecified laterality   THERAPY DIAG:  Chronic left shoulder pain   Muscle weakness (generalized)   Rationale for Evaluation and Treatment: Rehabilitation   ONSET DATE: Multiple years, but most recent flare up 3 months  ago    SUBJECTIVE:                                                                                                                                                                                       SUBJECTIVE STATEMENT:  Pt reports 1-2/10NPS in the front of her shoulder at  today's session. No notable changes since last session.   Hand dominance: Right   PERTINENT HISTORY: Pt is a 57 y/o F w/ chronic L shoulder pain for many years. Pt's most recent flare up occurred with insidious onset when she was sitting at her desk at work and felt a sharp, shooting pain down the front of her Lt shoulder. Pt reports difficulty with driving >03 minutes, overhead movements and ADL's such as putting on her bra and lifting activities. Pt's sx are relieved by ceasing movement, and icing her L shoulder. Pt also reports having intense migraines that are currently being managed by chiropractic services. Pt has been dx with carpal tunnel in bilat hands L >R as well, and has been wearing a brace to treat this condition. She addresses concerns of pain in the anterior shoulder near the biceps tendon, and clicking and popping in the L shoulder with movement. Along with, difficulty sleeping on her left side.  However, there are no concerns expressed of N/T down the L arm.    PAIN:  Are you having pain? Yes: NPRS scale: 1-2/10 Pain location: Back of the L shoulder, into the front of the L shoulder Pain description: Dull and achy,  Aggravating factors: Overhead movements, lifting activities Relieving factors: Ceasing movements, icing, chiropractor  Least pain: 2/10 sitting, Worst pain: 4/10 with activity  No significant change in physical activity to contribute to recent flare up.     PRECAUTIONS: None   RED FLAGS: None      WEIGHT BEARING RESTRICTIONS: No   FALLS:  Has patient fallen in last 6 months? 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    UPPER EXTREMITY ROM: All AROM grossly WFL, mild deficits w/ L shoulder IR AROM.    UPPER EXTREMITY MMT:   MMT Right eval Left eval  Shoulder flexion 4+ 4  Shoulder extension 4+ 4-  Shoulder abduction 4+ 4-  Shoulder adduction      Shoulder internal rotation 4+ 4-  Shoulder external rotation 4+ 4  Middle trapezius 4- 3+  Lower trapezius 4- 3+  Elbow flexion 4+ 4+  Elbow extension      Wrist flexion      Wrist extension      Wrist ulnar deviation      Wrist radial deviation      Wrist pronation      Wrist supination      Grip strength (lbs) 5 4+  (Blank rows = not tested)   CERVICAL SPECIAL TESTS: Spurling's: Negative    SHOULDER SPECIAL TESTS: Impingement tests: Hawkins/Kennedy impingement test: positive  Rotator cuff assessment: Drop arm test: negative, Empty can test: negative, and Full can test: positive    JOINT MOBILITY TESTING:  No notable hypomobility noted in L shoulder w/ A<>P and P <> A joint mobilizations.    PALPATION:  To be assessed at next visit.  TODAY'S TREATMENT: DATE: 09/18/23  There- ex:   UBE x2 min forward, x2 min backward lvl 5.0 Sidelying ER w/ 1KG medicine ball catching stability exercise LUE 3 x30 sec  Blue TB scaption 2 x12 LUE only  Standing shoulder flexion w/ 6# DB RUE/LUE 2 x12  Standing lateral raises w/ 5# DB 2 x12/ each side RUE/ LUE  Modified push up from elevated plinth table 3 x5   Standing IR at [facility] 5# 3 x8/ LUE only  Lat pulldowns at [facility] 30# 3 x12 Bicep curls #10 RUE/LUE 2 x10  PATIENT EDUCATION: Education details: HEP given to pt  Person educated: Patient Education method: Medical illustrator Education  comprehension: verbalized understanding and returned demonstration   HOME EXERCISE PROGRAM:  Access Code: [email or web address] URL: [email or web address] Date: 09/09/2023 Prepared by: Ronnie Derby  Exercises - Seated Scapular Retraction  - 1 x daily - 7 x weekly - 3 sets - 15 reps - Prone Shoulder Horizontal Abduction  - 1 x daily - 3-4 x weekly - 2 sets - 8 reps - Prone Single Arm Shoulder Y  - 1 x daily - 3-4 x weekly - 2 sets - 8 reps - Standing Bicep Curls Supinated with Dumbbells  - 1 x daily - 3-4 x weekly - 3 sets - 8 reps - Shoulder Flexion Serratus Activation with Resistance  - 1 x daily - 3-4 x weekly - 3 sets - 8 reps - Supine Scapular Protraction in Flexion with Dumbbells  - 1 x daily - 3-4 x weekly - 3 sets - 12 reps - Standing Median Nerve Glide  - 1 x daily - 7 x weekly - 3 sets - 10 reps  Access Code: [email or web address] URL: [email or web address] Date: 08/01/2023 Prepared by: Ronnie Derby   Exercises - Standing Isometric Shoulder Internal Rotation at Doorway  - 1 x daily - 7 x weekly - 10 sets - 10 hold - Seated Scapular Retraction  - 1 x daily - 7 x weekly - 2 sets - 10 reps - Prone Shoulder Horizontal Abduction  - 1 x daily - 7 x weekly - 2 sets - 8 reps - Prone Single Arm Shoulder Y  - 1 x daily - 7 x weekly - 2 sets - 8 reps   ASSESSMENT:   CLINICAL IMPRESSION:  Session focused on progressing pt's L shoulder strengthening exercise. Pt notes improvements in L shoulder strength displayed w/ improved activity tolerance to L shoulder exercise progressions at today's session. Pt continues to report mild L shoulder pain w/ lateral raises and w/ internal rotation activities. Pt would continue to benefit from skilled PT interventions to address remaining strength and pain deficits and improve QoL to return to PLOF.   OBJECTIVE IMPAIRMENTS: decreased ROM, decreased strength, and impaired UE functional use.    ACTIVITY LIMITATIONS: carrying, lifting, and reach  over head   PARTICIPATION LIMITATIONS: cleaning, driving, and community activity   PERSONAL FACTORS: Age, Fitness, and Time since onset of injury/illness/exacerbation are also affecting patient's functional outcome.    REHAB POTENTIAL: Good   CLINICAL DECISION MAKING: Stable/uncomplicated   EVALUATION COMPLEXITY: Low     GOALS: Goals reviewed with patient? Yes   SHORT TERM GOALS: Target date: 08/21/23   Pt will be independent with HEP to improve L shoulder strength and decrease pain with functional activities  Baseline: 07/31/23: HEP given to pt 09/09/23: HEP compliance Goal status: MET   LONG TERM GOALS: Target date: 10/07/23   Pt will improve FOTO to target score to  demonstrate clinically significant improvement in functional mobility.  Baseline: 07/31/23: 69/72 09/09/23: 75/72 Goal status: MET   2.  Pt will decrease pain to <2/10 to demonstrate clinically significant improvement in pain levels with functional activity and ADL's.  Baseline: 07/31/23: 4/10 09/09/23: 1-2/10  Goal status: MET   3.  Pt will self report the ability to utilize her LUE to drive >16 min, w/ <1/09 pain, to demonstrate improvements with ability to complete work related activities.  Baseline: 07/31/23: Cannot drive w/ LUE for >60 minutes w/o 4/10 pain. 09/09/23: Able to drive >45 minutes w/o pain  Goal status: MET  4. Pt will self report the ability to sleep w/ her left arm positioned to pt comfort, w/o N/T sx, to demonstrate improvements with ability to improve sleep quality.  Baseline: 09/09/23: N/T with LUE positioned underneath pillow preventing sleeping in that position Goal status: INITIAL   PLAN:   PT FREQUENCY: 2x/week   PT DURATION: 4 weeks   PLANNED INTERVENTIONS: Therapeutic exercises, Therapeutic activity, Neuromuscular re-education, Balance training, Gait training, Patient/Family education, Self Care, Joint mobilization, Joint manipulation, Dry Needling, Electrical stimulation, Spinal  manipulation, Spinal mobilization, Cryotherapy, Moist heat, Traction, and Manual therapy   PLAN FOR NEXT SESSION: Lateral shoulder strengthening exercises and IR strengthening exercises.   Lovie Macadamia, SPT  Delphia Grates. Fairly IV, PT, DPT Physical Therapist- [place]  [facility] 09/18/23, 8:19 PM

## 2023-09-23 ENCOUNTER — Ambulatory Visit: Payer: BC Managed Care – PPO

## 2023-09-23 ENCOUNTER — Other Ambulatory Visit: Payer: Self-pay | Admitting: Family Medicine

## 2023-09-23 DIAGNOSIS — G8929 Other chronic pain: Secondary | ICD-10-CM

## 2023-09-23 DIAGNOSIS — M25512 Pain in left shoulder: Secondary | ICD-10-CM | POA: Diagnosis not present

## 2023-09-23 DIAGNOSIS — M6281 Muscle weakness (generalized): Secondary | ICD-10-CM

## 2023-09-23 NOTE — Telephone Encounter (Signed)
Prescription Request  09/23/2023  LOV: 07/25/2023  What is the name of the medication or equipment? diclofenac (CATAFLAM) 50 MG tablet   Have you contacted your pharmacy to request a refill? No   Which pharmacy would you like this sent to?   [id number] [place], [place] - [address] AT [facility] [address] [place] [address] Phone: [phone] Fax: [phone]    Patient notified that their request is being sent to the clinical staff for review and that they should receive a response within 2 business days.   Please advise at Mobile [phone] (mobile)

## 2023-09-23 NOTE — Therapy (Cosign Needed)
OUTPATIENT PHYSICAL THERAPY SHOULDER TREATMENT     Patient Name: Margaret Brewer MRN: [id number] DOB:1966/01/02, 57 y.o., female Today's Date: 08/04/2023   END OF SESSION: PT End of Session - 08/04/23 1011       Visit Number 14    Number of Visits 21     Date for PT Re-Evaluation 10/07/2023    PT Start Time 1601    PT Stop Time 1644    PT Time Calculation (min) 43 min     Activity Tolerance Patient tolerated treatment well     Behavior During Therapy [facility] for tasks assessed/performed                         Past Medical History:  Diagnosis Date   Hyperlipidemia      with high HDL   Metatarsal fracture     Migraine without aura      varies with hormonal changes and weather changes             Past Surgical History:  Procedure Laterality Date   CHOLECYSTECTOMY   01/14/2002    Dr. Evette Cristal   ESOPHAGOGASTRODUODENOSCOPY (EGD) WITH PROPOFOL N/A 07/07/2015    Procedure: ESOPHAGOGASTRODUODENOSCOPY (EGD) WITH PROPOFOL;  Surgeon: Christena Deem, MD;  Location: [facility] ENDOSCOPY;  Service: Endoscopy;  Laterality: N/A;   OOPHORECTOMY   04/1986    Ovarian cyst, unilateral oophorectomy   TONSILLECTOMY        As a child            Patient Active Problem List    Diagnosis Date Noted   Hand paresthesia 07/27/2023   Vertigo 06/11/2023   Chest wall pain 05/14/2023   Right wrist pain 09/03/2022   Left foot pain 07/16/2020   Advance care planning 03/09/2015   Routine general medical examination at a health care facility 02/13/2012   Shoulder pain 02/13/2012   Migraine without aura 12/29/2007      PCP: Joaquim Nam, MD   REFERRING PROVIDER: Joaquim Nam, MD   REFERRING DIAG: M25.519 (ICD-10-CM) - Shoulder pain, unspecified chronicity, unspecified laterality   THERAPY DIAG:  Chronic left shoulder pain   Muscle weakness (generalized)   Rationale for Evaluation and Treatment: Rehabilitation   ONSET DATE: Multiple years, but most recent flare up 3 months  ago    SUBJECTIVE:                                                                                                                                                                                       SUBJECTIVE STATEMENT:  Pt reports 2/10NPS in the front of her shoulder at  today's session. Pt mentioned that she had an accidentally trip and fall yesterday, but no injuries occurred. No notable changes since last session.   Hand dominance: Right   PERTINENT HISTORY: Pt is a 57 y/o F w/ chronic L shoulder pain for many years. Pt's most recent flare up occurred with insidious onset when she was sitting at her desk at work and felt a sharp, shooting pain down the front of her Lt shoulder. Pt reports difficulty with driving >16 minutes, overhead movements and ADL's such as putting on her bra and lifting activities. Pt's sx are relieved by ceasing movement, and icing her L shoulder. Pt also reports having intense migraines that are currently being managed by chiropractic services. Pt has been dx with carpal tunnel in bilat hands L >R as well, and has been wearing a brace to treat this condition. She addresses concerns of pain in the anterior shoulder near the biceps tendon, and clicking and popping in the L shoulder with movement. Along with, difficulty sleeping on her left side.  However, there are no concerns expressed of N/T down the L arm.    PAIN:  Are you having pain? Yes: NPRS scale: 2/10 Pain location: Back of the L shoulder, into the front of the L shoulder Pain description: Dull and achy,  Aggravating factors: Overhead movements, lifting activities Relieving factors: Ceasing movements, icing, chiropractor  Least pain: 2/10 sitting, Worst pain: 4/10 with activity  No significant change in physical activity to contribute to recent flare up.     PRECAUTIONS: None   RED FLAGS: None      WEIGHT BEARING RESTRICTIONS: No   FALLS:  Has patient fallen in last 6 months? 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    UPPER EXTREMITY ROM: All AROM grossly WFL, mild deficits w/ L shoulder IR AROM.    UPPER EXTREMITY MMT:   MMT Right eval Left eval  Shoulder flexion 4+ 4  Shoulder extension 4+ 4-  Shoulder abduction 4+ 4-  Shoulder adduction      Shoulder internal rotation 4+ 4-  Shoulder external rotation 4+ 4  Middle trapezius 4- 3+  Lower trapezius 4- 3+  Elbow flexion 4+ 4+  Elbow extension      Wrist flexion      Wrist extension      Wrist ulnar deviation      Wrist radial deviation      Wrist pronation      Wrist supination      Grip strength (lbs) 5 4+  (Blank rows = not tested)   CERVICAL SPECIAL TESTS: Spurling's: Negative    SHOULDER SPECIAL TESTS: Impingement tests: Hawkins/Kennedy impingement test: positive  Rotator cuff assessment: Drop arm test: negative, Empty can test: negative, and Full can test: positive    JOINT MOBILITY TESTING:  No notable hypomobility noted in L shoulder w/ A<>P and P <> A joint mobilizations.    PALPATION:  To be assessed  at next visit.              TODAY'S TREATMENT: DATE: 09/23/23  There- ex:   UBE x2 min forward, x2 min backward lvl 5.0 Sidelying IR w/ 5# DB in LUE 3 x12  Black TB around distal wrist, bilat shoulder ER + flexion 3 x12  Blue TB scaption 2 x12 LUE only  Standing shoulder flexion w/ 6# DB RUE/LUE 3 x10 Standing lateral raises w/ 5# DB 3 x10/ each side RUE/ LUE  Standing IR at [facility] 5# 3 x10/ LUE only  Standing ER at [facility] 5# x5 LUE only (difficulty noted w/ ER strength exercises d/c)  Lat pulldowns at [employer] 30# 3 x12   PATIENT  EDUCATION: Education details: HEP given to pt  Person educated: Patient Education method: Medical illustrator Education comprehension: verbalized understanding and returned demonstration   HOME EXERCISE PROGRAM:  Access Code: [email or web address] URL: [email or web address] Date: 09/09/2023 Prepared by: Ronnie Derby  Exercises - Seated Scapular Retraction  - 1 x daily - 7 x weekly - 3 sets - 15 reps - Prone Shoulder Horizontal Abduction  - 1 x daily - 3-4 x weekly - 2 sets - 8 reps - Prone Single Arm Shoulder Y  - 1 x daily - 3-4 x weekly - 2 sets - 8 reps - Standing Bicep Curls Supinated with Dumbbells  - 1 x daily - 3-4 x weekly - 3 sets - 8 reps - Shoulder Flexion Serratus Activation with Resistance  - 1 x daily - 3-4 x weekly - 3 sets - 8 reps - Supine Scapular Protraction in Flexion with Dumbbells  - 1 x daily - 3-4 x weekly - 3 sets - 12 reps - Standing Median Nerve Glide  - 1 x daily - 7 x weekly - 3 sets - 10 reps  Access Code: [email or web address] URL: [email or web address] Date: 08/01/2023 Prepared by: Ronnie Derby   Exercises - Standing Isometric Shoulder Internal Rotation at Doorway  - 1 x daily - 7 x weekly - 10 sets - 10 hold - Seated Scapular Retraction  - 1 x daily - 7 x weekly - 2 sets - 10 reps - Prone Shoulder Horizontal Abduction  - 1 x daily - 7 x weekly - 2 sets - 8 reps - Prone Single Arm Shoulder Y  - 1 x daily - 7 x weekly - 2 sets - 8 reps   ASSESSMENT:   CLINICAL IMPRESSION:  Session focused on progressing pt's L shoulder strengthening exercise. Pt notes improvements in functional mobility of the LUE noted w/ improved activity tolerance with exercises at today's session. Pt continues to note increased pain levels w/ L shoulder abduction and IR. Along w/ increased L shoulder ER noted w/ attempting LUE ER on the pulley system w/ 5#. Future sessions will continue to progress L shoulder strengthening in all planes. Pt would continue to benefit  from skilled PT interventions to address remaining strength and pain deficits and improve QoL to return to PLOF.   OBJECTIVE IMPAIRMENTS: decreased ROM, decreased strength, and impaired UE functional use.    ACTIVITY LIMITATIONS: carrying, lifting, and reach over head   PARTICIPATION LIMITATIONS: cleaning, driving, and community activity   PERSONAL FACTORS: Age, Fitness, and Time since onset of injury/illness/exacerbation are also affecting patient's functional outcome.    REHAB POTENTIAL: Good   CLINICAL DECISION MAKING: Stable/uncomplicated   EVALUATION COMPLEXITY: Low     GOALS: Goals reviewed with patient? Yes   SHORT TERM GOALS: Target date: 08/21/23  Pt will be independent with HEP to improve L shoulder strength and decrease pain with functional activities  Baseline: 07/31/23: HEP given to pt 09/09/23: HEP compliance Goal status: MET   LONG TERM GOALS: Target date: 10/07/23   Pt will improve FOTO to target score to demonstrate clinically significant improvement in functional mobility.  Baseline: 07/31/23: 69/72 09/09/23: 75/72 Goal status: MET   2.  Pt will decrease pain to <2/10 to demonstrate clinically significant improvement in pain levels with functional activity and ADL's.  Baseline: 07/31/23: 4/10 09/09/23: 1-2/10  Goal status: MET   3.  Pt will self report the ability to utilize her LUE to drive >72 min, w/ <5/36 pain, to demonstrate improvements with ability to complete work related activities.  Baseline: 07/31/23: Cannot drive w/ LUE for >64 minutes w/o 4/10 pain. 09/09/23: Able to drive >40 minutes w/o pain  Goal status: MET  4. Pt will self report the ability to sleep w/ her left arm positioned to pt comfort, w/o N/T sx, to demonstrate improvements with ability to improve sleep quality.  Baseline: 09/09/23: N/T with LUE positioned underneath pillow preventing sleeping in that position Goal status: INITIAL   PLAN:   PT FREQUENCY: 2x/week   PT DURATION: 4  weeks   PLANNED INTERVENTIONS: Therapeutic exercises, Therapeutic activity, Neuromuscular re-education, Balance training, Gait training, Patient/Family education, Self Care, Joint mobilization, Joint manipulation, Dry Needling, Electrical stimulation, Spinal manipulation, Spinal mobilization, Cryotherapy, Moist heat, Traction, and Manual therapy   PLAN FOR NEXT SESSION: Isolate LUE vs RUE ER strength, Lateral shoulder strengthening exercises and IR strengthening exercises.   Lovie Macadamia, SPT  Delphia Grates. Fairly IV, PT, DPT Physical Therapist- [place]  [facility] 09/23/23, 4:54 PM

## 2023-09-23 NOTE — Telephone Encounter (Signed)
Called pt, she is using medication for migraines and is almost out.  Prescription pended for signature.  LOV: 07/25/23 NOV: None scheduled Last fill: 05/13/23

## 2023-09-24 MED ORDER — DICLOFENAC POTASSIUM 50 MG PO TABS: 50.0000 mg | ORAL_TABLET | Freq: Three times a day (TID) | ORAL | 1 refills | Status: DC | PRN

## 2023-09-24 NOTE — Telephone Encounter (Signed)
Sent. Thanks.   

## 2023-09-25 ENCOUNTER — Ambulatory Visit: Payer: BC Managed Care – PPO

## 2023-09-25 DIAGNOSIS — M6281 Muscle weakness (generalized): Secondary | ICD-10-CM

## 2023-09-25 DIAGNOSIS — G8929 Other chronic pain: Secondary | ICD-10-CM | POA: Diagnosis not present

## 2023-09-25 DIAGNOSIS — M25512 Pain in left shoulder: Secondary | ICD-10-CM | POA: Diagnosis not present

## 2023-09-25 NOTE — Therapy (Addendum)
OUTPATIENT PHYSICAL THERAPY SHOULDER TREATMENT     Patient Name: Margaret Brewer MRN: [id number] DOB:1966-06-29, 57 y.o., female Today's Date: 08/04/2023   END OF SESSION: PT End of Session - 08/04/23 1011       Visit Number 15    Number of Visits 21     Date for PT Re-Evaluation 10/07/2023    PT Start Time 1600    PT Stop Time 1641    PT Time Calculation (min) 41 min     Activity Tolerance Patient tolerated treatment well     Behavior During Therapy [facility] for tasks assessed/performed                         Past Medical History:  Diagnosis Date   Hyperlipidemia      with high HDL   Metatarsal fracture     Migraine without aura      varies with hormonal changes and weather changes             Past Surgical History:  Procedure Laterality Date   CHOLECYSTECTOMY   01/14/2002    Dr. Evette Cristal   ESOPHAGOGASTRODUODENOSCOPY (EGD) WITH PROPOFOL N/A 07/07/2015    Procedure: ESOPHAGOGASTRODUODENOSCOPY (EGD) WITH PROPOFOL;  Surgeon: Christena Deem, MD;  Location: [facility] ENDOSCOPY;  Service: Endoscopy;  Laterality: N/A;   OOPHORECTOMY   04/1986    Ovarian cyst, unilateral oophorectomy   TONSILLECTOMY        As a child            Patient Active Problem List    Diagnosis Date Noted   Hand paresthesia 07/27/2023   Vertigo 06/11/2023   Chest wall pain 05/14/2023   Right wrist pain 09/03/2022   Left foot pain 07/16/2020   Advance care planning 03/09/2015   Routine general medical examination at a health care facility 02/13/2012   Shoulder pain 02/13/2012   Migraine without aura 12/29/2007      PCP: Joaquim Nam, MD   REFERRING PROVIDER: Joaquim Nam, MD   REFERRING DIAG: M25.519 (ICD-10-CM) - Shoulder pain, unspecified chronicity, unspecified laterality   THERAPY DIAG:  Chronic left shoulder pain   Muscle weakness (generalized)   Rationale for Evaluation and Treatment: Rehabilitation   ONSET DATE: Multiple years, but most recent flare up 3 months  ago    SUBJECTIVE:                                                                                                                                                                                       SUBJECTIVE STATEMENT:  Pt reports 1-2/10 NPS in the L shoulder at today's  session. No notable changes since last session.   Hand dominance: Right   PERTINENT HISTORY: Pt is a 57 y/o F w/ chronic L shoulder pain for many years. Pt's most recent flare up occurred with insidious onset when she was sitting at her desk at work and felt a sharp, shooting pain down the front of her Lt shoulder. Pt reports difficulty with driving >96 minutes, overhead movements and ADL's such as putting on her bra and lifting activities. Pt's sx are relieved by ceasing movement, and icing her L shoulder. Pt also reports having intense migraines that are currently being managed by chiropractic services. Pt has been dx with carpal tunnel in bilat hands L >R as well, and has been wearing a brace to treat this condition. She addresses concerns of pain in the anterior shoulder near the biceps tendon, and clicking and popping in the L shoulder with movement. Along with, difficulty sleeping on her left side.  However, there are no concerns expressed of N/T down the L arm.    PAIN:  Are you having pain? Yes: NPRS scale: 2/10 Pain location: Back of the L shoulder, into the front of the L shoulder Pain description: Dull and achy,  Aggravating factors: Overhead movements, lifting activities Relieving factors: Ceasing movements, icing, chiropractor  Least pain: 2/10 sitting, Worst pain: 4/10 with activity  No significant change in physical activity to contribute to recent flare up.     PRECAUTIONS: None   RED FLAGS: None      WEIGHT BEARING RESTRICTIONS: No   FALLS:  Has patient fallen in last 6 months? 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    UPPER EXTREMITY ROM: All AROM grossly WFL, mild deficits w/ L shoulder IR AROM.    UPPER EXTREMITY MMT:   MMT Right eval Left eval  Shoulder flexion 4+ 4  Shoulder extension 4+ 4-  Shoulder abduction 4+ 4-  Shoulder adduction      Shoulder internal rotation 4+ 4-  Shoulder external rotation 4+ 4  Middle trapezius 4- 3+  Lower trapezius 4- 3+  Elbow flexion 4+ 4+  Elbow extension      Wrist flexion      Wrist extension      Wrist ulnar deviation      Wrist radial deviation      Wrist pronation      Wrist supination      Grip strength (lbs) 5 4+  (Blank rows = not tested)   CERVICAL SPECIAL TESTS: Spurling's: Negative    SHOULDER SPECIAL TESTS: Impingement tests: Hawkins/Kennedy impingement test: positive  Rotator cuff assessment: Drop arm test: negative, Empty can test: negative, and Full can test: positive    JOINT MOBILITY TESTING:  No notable hypomobility noted in L shoulder w/ A<>P and P <> A joint mobilizations.    PALPATION:  To be assessed at next visit.  TODAY'S TREATMENT: DATE: 09/25/23  There- ex:   UBE x2 min forward, x2 min backward lvl 5.0 Tall kneeling ER on seat at [facility] 5# 2 x10 Standing IR at [facility] 5#  LUE only 2 x12 Sidelying ER w/ 5# DB in LUE 1 x12, 6# DB   Standing shoulder flexion w/ 6# DB RUE/LUE 2 x12 Standing lateral shoulder raises w/ 5# DB 2 x12/ each side Standing IR from LUE to RUE w/ 5# DB around the back x Push ups onto elevated plinth 3 x6  Alternating bird dogs w/ 4# DB x10/ each Forearm plank x 30 sec  Arm extended plank (traditional) x30 sec  PATIENT EDUCATION: Education details: HEP given to pt  Person educated: Patient Education  method: Medical illustrator Education comprehension: verbalized understanding and returned demonstration   HOME EXERCISE PROGRAM:  Access Code: [email or web address] URL: [email or web address] Date: 09/09/2023 Prepared by: Ronnie Derby  Exercises - Seated Scapular Retraction  - 1 x daily - 7 x weekly - 3 sets - 15 reps - Prone Shoulder Horizontal Abduction  - 1 x daily - 3-4 x weekly - 2 sets - 8 reps - Prone Single Arm Shoulder Y  - 1 x daily - 3-4 x weekly - 2 sets - 8 reps - Standing Bicep Curls Supinated with Dumbbells  - 1 x daily - 3-4 x weekly - 3 sets - 8 reps - Shoulder Flexion Serratus Activation with Resistance  - 1 x daily - 3-4 x weekly - 3 sets - 8 reps - Supine Scapular Protraction in Flexion with Dumbbells  - 1 x daily - 3-4 x weekly - 3 sets - 12 reps - Standing Median Nerve Glide  - 1 x daily - 7 x weekly - 3 sets - 10 reps  Access Code: [email or web address] URL: [email or web address] Date: 08/01/2023 Prepared by: Ronnie Derby   Exercises - Standing Isometric Shoulder Internal Rotation at Doorway  - 1 x daily - 7 x weekly - 10 sets - 10 hold - Seated Scapular Retraction  - 1 x daily - 7 x weekly - 2 sets - 10 reps - Prone Shoulder Horizontal Abduction  - 1 x daily - 7 x weekly - 2 sets - 8 reps - Prone Single Arm Shoulder Y  - 1 x daily - 7 x weekly - 2 sets - 8 reps   ASSESSMENT:   CLINICAL IMPRESSION:  Session focused on progressing pt's L shoulder strengthening exercise. Pt notes improvements in CKC bilat shoulder activities displayed w improved activity tolerance to traditional plank stance. This is a position that has been previously difficult for the pt to complete. Pt plans to attempt UE virtual workout and will assess tolerance to exercises at next visit. Pt continues to note increased L shoulder pain w/ increasing # with lateral raises and IR movements and notable L shoulder ER weakness compared to RUE. Pt would continue to benefit from  skilled PT interventions to address remaining strength and pain deficits and improve QoL to return to PLOF.   OBJECTIVE IMPAIRMENTS: decreased ROM, decreased strength, and impaired UE functional use.    ACTIVITY LIMITATIONS: carrying, lifting, and reach over head   PARTICIPATION LIMITATIONS: cleaning, driving, and community activity   PERSONAL FACTORS: Age, Fitness, and Time since onset of injury/illness/exacerbation are also affecting patient's functional outcome.    REHAB POTENTIAL: Good   CLINICAL DECISION MAKING: Stable/uncomplicated   EVALUATION COMPLEXITY: Low     GOALS: Goals reviewed with patient? Yes   SHORT TERM GOALS:  Target date: 08/21/23   Pt will be independent with HEP to improve L shoulder strength and decrease pain with functional activities  Baseline: 07/31/23: HEP given to pt 09/09/23: HEP compliance Goal status: MET   LONG TERM GOALS: Target date: 10/07/23   Pt will improve FOTO to target score to demonstrate clinically significant improvement in functional mobility.  Baseline: 07/31/23: 69/72 09/09/23: 75/72 Goal status: MET   2.  Pt will decrease pain to <2/10 to demonstrate clinically significant improvement in pain levels with functional activity and ADL's.  Baseline: 07/31/23: 4/10 09/09/23: 1-2/10  Goal status: MET   3.  Pt will self report the ability to utilize her LUE to drive >29 min, w/ <5/18 pain, to demonstrate improvements with ability to complete work related activities.  Baseline: 07/31/23: Cannot drive w/ LUE for >84 minutes w/o 4/10 pain. 09/09/23: Able to drive >16 minutes w/o pain  Goal status: MET  4. Pt will self report the ability to sleep w/ her left arm positioned to pt comfort, w/o N/T sx, to demonstrate improvements with ability to improve sleep quality.  Baseline: 09/09/23: N/T with LUE positioned underneath pillow preventing sleeping in that position Goal status: INITIAL   PLAN:   PT FREQUENCY: 2x/week   PT DURATION: 4 weeks    PLANNED INTERVENTIONS: Therapeutic exercises, Therapeutic activity, Neuromuscular re-education, Balance training, Gait training, Patient/Family education, Self Care, Joint mobilization, Joint manipulation, Dry Needling, Electrical stimulation, Spinal manipulation, Spinal mobilization, Cryotherapy, Moist heat, Traction, and Manual therapy   PLAN FOR NEXT SESSION: Assess tolerance to Peloton exercises, Lateral shoulder strengthening exercises and IR strengthening exercises.   Lovie Macadamia, SPT  Delphia Grates. Fairly IV, PT, DPT Physical Therapist- [facility] 09/25/23, 9:27 PM

## 2023-09-29 ENCOUNTER — Ambulatory Visit (INDEPENDENT_AMBULATORY_CARE_PROVIDER_SITE_OTHER): Payer: Self-pay

## 2023-09-29 ENCOUNTER — Ambulatory Visit: Payer: BC Managed Care – PPO

## 2023-09-29 DIAGNOSIS — M6281 Muscle weakness (generalized): Secondary | ICD-10-CM | POA: Diagnosis not present

## 2023-09-29 DIAGNOSIS — G8929 Other chronic pain: Secondary | ICD-10-CM

## 2023-09-29 DIAGNOSIS — Z23 Encounter for immunization: Secondary | ICD-10-CM

## 2023-09-29 DIAGNOSIS — M25512 Pain in left shoulder: Secondary | ICD-10-CM | POA: Diagnosis not present

## 2023-09-29 NOTE — Therapy (Addendum)
OUTPATIENT PHYSICAL THERAPY SHOULDER TREATMENT     Patient Name: Margaret Brewer MRN: [id number] DOB:September 08, 1966, 57 y.o., female, female Today's Date: 08/04/2023   END OF SESSION: PT End of Session - 08/04/23 1011       Visit Number 16    Number of Visits 21     Date for PT Re-Evaluation 10/07/2023    PT Start Time 0815    PT Stop Time 0845    PT Time Calculation (min) 30 min     Activity Tolerance Patient tolerated treatment well     Behavior During Therapy [facility] for tasks assessed/performed                         Past Medical History:  Diagnosis Date   Hyperlipidemia      with high HDL   Metatarsal fracture     Migraine without aura      varies with hormonal changes and weather changes             Past Surgical History:  Procedure Laterality Date   CHOLECYSTECTOMY   01/14/2002    Dr. Evette Cristal   ESOPHAGOGASTRODUODENOSCOPY (EGD) WITH PROPOFOL N/A 07/07/2015    Procedure: ESOPHAGOGASTRODUODENOSCOPY (EGD) WITH PROPOFOL;  Surgeon: Christena Deem, MD;  Location: [facility] ENDOSCOPY;  Service: Endoscopy;  Laterality: N/A;   OOPHORECTOMY   04/1986    Ovarian cyst, unilateral oophorectomy   TONSILLECTOMY        As a child            Patient Active Problem List    Diagnosis Date Noted   Hand paresthesia 07/27/2023   Vertigo 06/11/2023   Chest wall pain 05/14/2023   Right wrist pain 09/03/2022   Left foot pain 07/16/2020   Advance care planning 03/09/2015   Routine general medical examination at a health care facility 02/13/2012   Shoulder pain 02/13/2012   Migraine without aura 12/29/2007      PCP: Joaquim Nam, MD   REFERRING PROVIDER: Joaquim Nam, MD   REFERRING DIAG: M25.519 (ICD-10-CM) - Shoulder pain, unspecified chronicity, unspecified laterality   THERAPY DIAG:  Chronic left shoulder pain   Muscle weakness (generalized)   Rationale for Evaluation and Treatment: Rehabilitation   ONSET DATE: Multiple years, but most recent flare up 3 months  ago    SUBJECTIVE:                                                                                                                                                                                       SUBJECTIVE STATEMENT:  Pt reports 1-2/10 NPS in the L shoulder. Pt states  that she was able to perform UE Peloton exercises w/o a significant increase in pain.    Hand dominance: Right   PERTINENT HISTORY: Pt is a 57 y/o F w/ chronic L shoulder pain for many years. Pt's most recent flare up occurred with insidious onset when she was sitting at her desk at work and felt a sharp, shooting pain down the front of her Lt shoulder. Pt reports difficulty with driving >16 minutes, overhead movements and ADL's such as putting on her bra and lifting activities. Pt's sx are relieved by ceasing movement, and icing her L shoulder. Pt also reports having intense migraines that are currently being managed by chiropractic services. Pt has been dx with carpal tunnel in bilat hands L >R as well, and has been wearing a brace to treat this condition. She addresses concerns of pain in the anterior shoulder near the biceps tendon, and clicking and popping in the L shoulder with movement. Along with, difficulty sleeping on her left side.  However, there are no concerns expressed of N/T down the L arm.    PAIN:  Are you having pain? Yes: NPRS scale: 1-2/10 Pain location: Back of the L shoulder, into the front of the L shoulder Pain description: Dull and achy,  Aggravating factors: Overhead movements, lifting activities Relieving factors: Ceasing movements, icing, chiropractor  Least pain: 2/10 sitting, Worst pain: 4/10 with activity  No significant change in physical activity to contribute to recent flare up.     PRECAUTIONS: None   RED FLAGS: None      WEIGHT BEARING RESTRICTIONS: No   FALLS:  Has patient fallen in last 6 months? 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    UPPER EXTREMITY ROM: All AROM grossly WFL, mild deficits w/ L shoulder IR AROM.    UPPER EXTREMITY MMT:   MMT Right eval Left eval  Shoulder flexion 4+ 4  Shoulder extension 4+ 4-  Shoulder abduction 4+ 4-  Shoulder adduction      Shoulder internal rotation 4+ 4-  Shoulder external rotation 4+ 4  Middle trapezius 4- 3+  Lower trapezius 4- 3+  Elbow flexion 4+ 4+  Elbow extension      Wrist flexion      Wrist extension      Wrist ulnar deviation      Wrist radial deviation      Wrist pronation      Wrist supination      Grip strength (lbs) 5 4+  (Blank rows = not tested)   CERVICAL SPECIAL TESTS: Spurling's: Negative    SHOULDER SPECIAL TESTS: Impingement tests: Hawkins/Kennedy impingement test: positive  Rotator cuff assessment: Drop arm test: negative, Empty can test: negative, and Full can test: positive    JOINT MOBILITY TESTING:  No notable hypomobility noted in L shoulder w/ A<>P and P <> A joint mobilizations.    PALPATION:  To be assessed at next visit.  TODAY'S TREATMENT: DATE: 09/29/23  There- ex:  UBE x2 min forward, x2 min backward lvl 6.0 for UE strengthening  Standing shoulder flexion + ER w/ blue TB 2 x12 Standing IR/ ER of L shoulder at 90 deg w/ Blue TB 2 x12/ each side  Standing pec fly's with blue TB 2 x12  Standing shoulder flexion w/ 6# DB RUE/LUE 2 x12 Standing lateral shoulder raises w/ 5# DB 2 x10/ each side Push ups onto elevated plinth 2 x5   PATIENT EDUCATION: Education details: HEP given to pt  Person educated: Patient Education method: Medical illustrator Education comprehension: verbalized  understanding and returned demonstration   HOME EXERCISE PROGRAM:  Access Code: [email or web address] URL: [email or web address] Date: 09/09/2023 Prepared by: Ronnie Derby  Exercises - Seated Scapular Retraction  - 1 x daily - 7 x weekly - 3 sets - 15 reps - Prone Shoulder Horizontal Abduction  - 1 x daily - 3-4 x weekly - 2 sets - 8 reps - Prone Single Arm Shoulder Y  - 1 x daily - 3-4 x weekly - 2 sets - 8 reps - Standing Bicep Curls Supinated with Dumbbells  - 1 x daily - 3-4 x weekly - 3 sets - 8 reps - Shoulder Flexion Serratus Activation with Resistance  - 1 x daily - 3-4 x weekly - 3 sets - 8 reps - Supine Scapular Protraction in Flexion with Dumbbells  - 1 x daily - 3-4 x weekly - 3 sets - 12 reps - Standing Median Nerve Glide  - 1 x daily - 7 x weekly - 3 sets - 10 reps  Access Code: [email or web address] URL: [email or web address] Date: 08/01/2023 Prepared by: Ronnie Derby   Exercises - Standing Isometric Shoulder Internal Rotation at Doorway  - 1 x daily - 7 x weekly - 10 sets - 10 hold - Seated Scapular Retraction  - 1 x daily - 7 x weekly - 2 sets - 10 reps - Prone Shoulder Horizontal Abduction  - 1 x daily - 7 x weekly - 2 sets - 8 reps - Prone Single Arm Shoulder Y  - 1 x daily - 7 x weekly - 2 sets - 8 reps   ASSESSMENT:   CLINICAL IMPRESSION:  Session focused on progressing pt's L shoulder strengthening exercises. Pt continues to note improved L shoulder strength noted w/ increased activity tolerance to exercises at today's session. Pt displays increased discomfort and weakness with L shoulder ER, future sessions will continue to emphasize L shoulder ER exercises. Pt is showing progression w/ PT noted with improved ability to return to UE workout regimen w/o a significant increase in pain. Pt would continue to benefit from skilled PT interventions to address remaining strength and pain deficits.   OBJECTIVE IMPAIRMENTS: decreased ROM, decreased strength, and  impaired UE functional use.    ACTIVITY LIMITATIONS: carrying, lifting, and reach over head   PARTICIPATION LIMITATIONS: cleaning, driving, and community activity   PERSONAL FACTORS: Age, Fitness, and Time since onset of injury/illness/exacerbation are also affecting patient's functional outcome.    REHAB POTENTIAL: Good   CLINICAL DECISION MAKING: Stable/uncomplicated   EVALUATION COMPLEXITY: Low     GOALS: Goals reviewed with patient? Yes   SHORT TERM GOALS: Target date: 08/21/23   Pt will be independent with HEP to improve L shoulder strength and decrease pain with functional activities  Baseline: 07/31/23: HEP given to pt 09/09/23: HEP compliance Goal status: MET   LONG TERM GOALS: Target date: 10/07/23   Pt will improve  FOTO to target score to demonstrate clinically significant improvement in functional mobility.  Baseline: 07/31/23: 69/72 09/09/23: 75/72 Goal status: MET   2.  Pt will decrease pain to <2/10 to demonstrate clinically significant improvement in pain levels with functional activity and ADL's.  Baseline: 07/31/23: 4/10 09/09/23: 1-2/10  Goal status: MET   3.  Pt will self report the ability to utilize her LUE to drive >14 min, w/ <7/82 pain, to demonstrate improvements with ability to complete work related activities.  Baseline: 07/31/23: Cannot drive w/ LUE for >95 minutes w/o 4/10 pain. 09/09/23: Able to drive >62 minutes w/o pain  Goal status: MET  4. Pt will self report the ability to sleep w/ her left arm positioned to pt comfort, w/o N/T sx, to demonstrate improvements with ability to improve sleep quality.  Baseline: 09/09/23: N/T with LUE positioned underneath pillow preventing sleeping in that position Goal status: INITIAL   PLAN:   PT FREQUENCY: 2x/week   PT DURATION: 4 weeks   PLANNED INTERVENTIONS: Therapeutic exercises, Therapeutic activity, Neuromuscular re-education, Balance training, Gait training, Patient/Family education, Self Care, Joint  mobilization, Joint manipulation, Dry Needling, Electrical stimulation, Spinal manipulation, Spinal mobilization, Cryotherapy, Moist heat, Traction, and Manual therapy   PLAN FOR NEXT SESSION: Lateral shoulder strengthening exercises and ER strengthening exercises.   Lovie Macadamia, SPT  Delphia Grates. Fairly IV, PT, DPT Physical Therapist- [facility] 09/29/23, 12:39 PM

## 2023-10-01 ENCOUNTER — Ambulatory Visit: Payer: BC Managed Care – PPO

## 2023-10-01 DIAGNOSIS — M6281 Muscle weakness (generalized): Secondary | ICD-10-CM | POA: Diagnosis not present

## 2023-10-01 DIAGNOSIS — M25512 Pain in left shoulder: Secondary | ICD-10-CM | POA: Diagnosis not present

## 2023-10-01 DIAGNOSIS — G8929 Other chronic pain: Secondary | ICD-10-CM

## 2023-10-01 NOTE — Therapy (Addendum)
OUTPATIENT PHYSICAL THERAPY SHOULDER TREATMENT     Patient Name: Margaret Brewer MRN: [id number] DOB:[birth year], 57 y.o., female Today's Date: 08/04/2023   END OF SESSION: PT End of Session - 08/04/23 1011       Visit Number 17    Number of Visits 21     Date for PT Re-Evaluation 10/07/2023    PT Start Time 0819    PT Stop Time 0858    PT Time Calculation (min) 39 min     Activity Tolerance Patient tolerated treatment well     Behavior During Therapy [facility] for tasks assessed/performed                         Past Medical History:  Diagnosis Date   Hyperlipidemia      with high HDL   Metatarsal fracture     Migraine without aura      varies with hormonal changes and weather changes             Past Surgical History:  Procedure Laterality Date   CHOLECYSTECTOMY   01/14/2002    Dr. Evette Cristal   ESOPHAGOGASTRODUODENOSCOPY (EGD) WITH PROPOFOL N/A 07/07/2015    Procedure: ESOPHAGOGASTRODUODENOSCOPY (EGD) WITH PROPOFOL;  Surgeon: Christena Deem, MD;  Location: [facility] ENDOSCOPY;  Service: Endoscopy;  Laterality: N/A;   OOPHORECTOMY   04/1986    Ovarian cyst, unilateral oophorectomy   TONSILLECTOMY        As a child            Patient Active Problem List    Diagnosis Date Noted   Hand paresthesia 07/27/2023   Vertigo 06/11/2023   Chest wall pain 05/14/2023   Right wrist pain 09/03/2022   Left foot pain 07/16/2020   Advance care planning 03/09/2015   Routine general medical examination at a health care facility 02/13/2012   Shoulder pain 02/13/2012   Migraine without aura 12/29/2007      PCP: Joaquim Nam, MD   REFERRING PROVIDER: Joaquim Nam, MD   REFERRING DIAG: M25.519 (ICD-10-CM) - Shoulder pain, unspecified chronicity, unspecified laterality   THERAPY DIAG:  Chronic left shoulder pain   Muscle weakness (generalized)   Rationale for Evaluation and Treatment: Rehabilitation   ONSET DATE: Multiple years, but most recent flare up 3 months  ago    SUBJECTIVE:                                                                                                                                                                                       SUBJECTIVE STATEMENT:  Pt reports no pain in the L shoulder. Pt reports  being able to complete another UE Peloton work out pain -free.    Hand dominance: Right   PERTINENT HISTORY: Pt is a 57 y/o F w/ chronic L shoulder pain for many years. Pt's most recent flare up occurred with insidious onset when she was sitting at her desk at work and felt a sharp, shooting pain down the front of her Lt shoulder. Pt reports difficulty with driving >16 minutes, overhead movements and ADL's such as putting on her bra and lifting activities. Pt's sx are relieved by ceasing movement, and icing her L shoulder. Pt also reports having intense migraines that are currently being managed by chiropractic services. Pt has been dx with carpal tunnel in bilat hands L >R as well, and has been wearing a brace to treat this condition. She addresses concerns of pain in the anterior shoulder near the biceps tendon, and clicking and popping in the L shoulder with movement. Along with, difficulty sleeping on her left side.  However, there are no concerns expressed of N/T down the L arm.    PAIN:  Are you having pain? Yes: NPRS scale: 1-2/10 Pain location: Back of the L shoulder, into the front of the L shoulder Pain description: Dull and achy,  Aggravating factors: Overhead movements, lifting activities Relieving factors: Ceasing movements, icing, chiropractor  Least pain: 2/10 sitting, Worst pain: 4/10 with activity  No significant change in physical activity to contribute to recent flare up.     PRECAUTIONS: None   RED FLAGS: None      WEIGHT BEARING RESTRICTIONS: No   FALLS:  Has patient fallen in last 6 months? 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    UPPER EXTREMITY ROM: All AROM grossly WFL, mild deficits w/ L shoulder IR AROM.    UPPER EXTREMITY MMT:   MMT Right eval Left eval  Shoulder flexion 4+ 4  Shoulder extension 4+ 4-  Shoulder abduction 4+ 4-  Shoulder adduction      Shoulder internal rotation 4+ 4-  Shoulder external rotation 4+ 4  Middle trapezius 4- 3+  Lower trapezius 4- 3+  Elbow flexion 4+ 4+  Elbow extension      Wrist flexion      Wrist extension      Wrist ulnar deviation      Wrist radial deviation      Wrist pronation      Wrist supination      Grip strength (lbs) 5 4+  (Blank rows = not tested)   CERVICAL SPECIAL TESTS: Spurling's: Negative    SHOULDER SPECIAL TESTS: Impingement tests: Hawkins/Kennedy impingement test: positive  Rotator cuff assessment: Drop arm test: negative, Empty can test: negative, and Full can test: positive    JOINT MOBILITY TESTING:  No notable hypomobility noted in L shoulder w/ A<>P and P <> A joint mobilizations.    PALPATION:  To be assessed at next visit.  TODAY'S TREATMENT: DATE: 10/01/23  There- ex:  UBE x2 min forward, x2 min backward lvl 6.0 for UE strengthening and mobility Standing bilat shoulder ER + scap retraction w/ blue TB 2 x12  Standing pec fly's with blue TB 2 x12  Standing TRX T-rows w/ shoulders abducted to 90 deg 2 x12  Standing L shoulder IR at cable column w/ 5# 3 x12 Seated lat pull downs on cable column 30# x12, 35# 2 x12 Standing lateral shoulder raises w/ 5# DB 2 x12/ each side "Y's" on clear physioball bilat Ue's 3# 1 x12, #4 2 x12 "T's" on clear physioball bilat Ue's 4# 2x12    PATIENT EDUCATION: Education details: HEP given  to pt  Person educated: [name] Education method: Medical illustrator Education comprehension: verbalized understanding and returned demonstration   HOME EXERCISE PROGRAM:  Access Code: [email or web address] URL: [email or web address] Date: 09/09/2023 Prepared by: Ronnie Derby  Exercises - Seated Scapular Retraction  - 1 x daily - 7 x weekly - 3 sets - 15 reps - Prone Shoulder Horizontal Abduction  - 1 x daily - 3-4 x weekly - 2 sets - 8 reps - Prone Single Arm Shoulder Y  - 1 x daily - 3-4 x weekly - 2 sets - 8 reps - Standing Bicep Curls Supinated with Dumbbells  - 1 x daily - 3-4 x weekly - 3 sets - 8 reps - Shoulder Flexion Serratus Activation with Resistance  - 1 x daily - 3-4 x weekly - 3 sets - 8 reps - Supine Scapular Protraction in Flexion with Dumbbells  - 1 x daily - 3-4 x weekly - 3 sets - 12 reps - Standing Median Nerve Glide  - 1 x daily - 7 x weekly - 3 sets - 10 reps  Access Code: [email or web address] URL: [email or web address] Date: 08/01/2023 Prepared by: Ronnie Derby   Exercises - Standing Isometric Shoulder Internal Rotation at Doorway  - 1 x daily - 7 x weekly - 10 sets - 10 hold - Seated Scapular Retraction  - 1 x daily - 7 x weekly - 2 sets - 10 reps - Prone Shoulder Horizontal Abduction  - 1 x daily - 7 x weekly - 2 sets - 8 reps - Prone Single Arm Shoulder Y  - 1 x daily - 7 x weekly - 2 sets - 8 reps   ASSESSMENT:   CLINICAL IMPRESSION:  Session focused on progressing pt's L shoulder strengthening exercises. Pt continues to note strength improvements in the L shoulder displayed with increasing weight and resistance used during exercises at today's session. Pt self reports being able to complete UE workout classes outside of PT pain-free in the L shoulder, along with decreased pain overall. Pt requiring a re-certification at next visit and will determine at that point if patient is appropriate for d/c based upon her goal completion.    OBJECTIVE IMPAIRMENTS: decreased ROM, decreased strength, and impaired UE functional use.    ACTIVITY LIMITATIONS: carrying, lifting, and reach over head   PARTICIPATION LIMITATIONS: cleaning, driving, and community activity   PERSONAL FACTORS: Age, Fitness, and Time since onset of injury/illness/exacerbation are also affecting patient's functional outcome.    REHAB POTENTIAL: Good   CLINICAL DECISION MAKING: Stable/uncomplicated   EVALUATION COMPLEXITY: Low     GOALS: Goals reviewed with patient? Yes   SHORT TERM GOALS: Target date: 08/21/23   Pt will be independent with HEP to improve L shoulder strength and decrease pain with functional activities  Baseline: 07/31/23: HEP given to  pt 09/09/23: HEP compliance Goal status: MET   LONG TERM GOALS: Target date: 10/07/23   Pt will improve FOTO to target score to demonstrate clinically significant improvement in functional mobility.  Baseline: 07/31/23: 69/72 09/09/23: 75/72 Goal status: MET   2.  Pt will decrease pain to <2/10 to demonstrate clinically significant improvement in pain levels with functional activity and ADL's.  Baseline: 07/31/23: 4/10 09/09/23: 1-2/10  Goal status: MET   3.  Pt will self report the ability to utilize her LUE to drive >16 min, w/ <1/09 pain, to demonstrate improvements with ability to complete work related activities.  Baseline: 07/31/23: Cannot drive w/ LUE for >60 minutes w/o 4/10 pain. 09/09/23: Able to drive >45 minutes w/o pain  Goal status: MET  4. Pt will self report the ability to sleep w/ her left arm positioned to pt comfort, w/o N/T sx, to demonstrate improvements with ability to improve sleep quality.  Baseline: 09/09/23: N/T with LUE positioned underneath pillow preventing sleeping in that position Goal status: INITIAL   PLAN:   PT FREQUENCY: 2x/week   PT DURATION: 4 weeks   PLANNED INTERVENTIONS: Therapeutic exercises, Therapeutic activity, Neuromuscular re-education, Balance  training, Gait training, Patient/Family education, Self Care, Joint mobilization, Joint manipulation, Dry Needling, Electrical stimulation, Spinal manipulation, Spinal mobilization, Cryotherapy, Moist heat, Traction, and Manual therapy   PLAN FOR NEXT SESSION: Re-certification or discharge!    Lovie Macadamia, SPT  Delphia Grates. Fairly IV, PT, DPT Physical Therapist- [place]  [facility] 10/01/23, 9:39 AM

## 2023-10-07 DIAGNOSIS — G43719 Chronic migraine without aura, intractable, without status migrainosus: Secondary | ICD-10-CM | POA: Diagnosis not present

## 2023-10-07 DIAGNOSIS — M542 Cervicalgia: Secondary | ICD-10-CM | POA: Diagnosis not present

## 2023-10-08 ENCOUNTER — Ambulatory Visit: Payer: BC Managed Care – PPO

## 2023-10-08 DIAGNOSIS — G8929 Other chronic pain: Secondary | ICD-10-CM | POA: Diagnosis not present

## 2023-10-08 DIAGNOSIS — M25512 Pain in left shoulder: Secondary | ICD-10-CM | POA: Diagnosis not present

## 2023-10-08 DIAGNOSIS — M6281 Muscle weakness (generalized): Secondary | ICD-10-CM

## 2023-10-08 NOTE — Therapy (Addendum)
OUTPATIENT PHYSICAL THERAPY SHOULDER TREATMENT/DISCHARGE SUMMARY     Patient Name: Margaret Brewer MRN: [id number] DOB:23-Oct-1966, 57 y.o., female Today's Date: 08/04/2023   END OF SESSION: PT End of Session - 08/04/23 1011       Visit Number 18    Number of Visits 21     Date for PT Re-Evaluation 10/07/2023    PT Start Time 0815    PT Stop Time 0858    PT Time Calculation (min) 43 min     Activity Tolerance Patient tolerated treatment well     Behavior During Therapy [facility] for tasks assessed/performed                         Past Medical History:  Diagnosis Date   Hyperlipidemia      with high HDL   Metatarsal fracture     Migraine without aura      varies with hormonal changes and weather changes             Past Surgical History:  Procedure Laterality Date   CHOLECYSTECTOMY   01/14/2002    Dr. Evette Cristal   ESOPHAGOGASTRODUODENOSCOPY (EGD) WITH PROPOFOL N/A 07/07/2015    Procedure: ESOPHAGOGASTRODUODENOSCOPY (EGD) WITH PROPOFOL;  Surgeon: Christena Deem, MD;  Location: [facility] ENDOSCOPY;  Service: Endoscopy;  Laterality: N/A;   OOPHORECTOMY   04/1986    Ovarian cyst, unilateral oophorectomy   TONSILLECTOMY        As a child            Patient Active Problem List    Diagnosis Date Noted   Hand paresthesia 07/27/2023   Vertigo 06/11/2023   Chest wall pain 05/14/2023   Right wrist pain 09/03/2022   Left foot pain 07/16/2020   Advance care planning 03/09/2015   Routine general medical examination at a health care facility 02/13/2012   Shoulder pain 02/13/2012   Migraine without aura 12/29/2007      PCP: Joaquim Nam, MD   REFERRING PROVIDER: Joaquim Nam, MD   REFERRING DIAG: M25.519 (ICD-10-CM) - Shoulder pain, unspecified chronicity, unspecified laterality   THERAPY DIAG:  Chronic left shoulder pain   Muscle weakness (generalized)   Rationale for Evaluation and Treatment: Rehabilitation   ONSET DATE: Multiple years, but most recent  flare up 3 months ago    SUBJECTIVE:                                                                                                                                                                                       SUBJECTIVE STATEMENT:  Pt reports no pain in the L shoulder. Pt  reports increased L shoulder pain following a 9 hour car ride over the weekend, however this pain has subsided since then.    Hand dominance: Right   PERTINENT HISTORY: Pt is a 57 y/o F w/ chronic L shoulder pain for many years. Pt's most recent flare up occurred with insidious onset when she was sitting at her desk at work and felt a sharp, shooting pain down the front of her Lt shoulder. Pt reports difficulty with driving >79 minutes, overhead movements and ADL's such as putting on her bra and lifting activities. Pt's sx are relieved by ceasing movement, and icing her L shoulder. Pt also reports having intense migraines that are currently being managed by chiropractic services. Pt has been dx with carpal tunnel in bilat hands L >R as well, and has been wearing a brace to treat this condition. She addresses concerns of pain in the anterior shoulder near the biceps tendon, and clicking and popping in the L shoulder with movement. Along with, difficulty sleeping on her left side.  However, there are no concerns expressed of N/T down the L arm.    PAIN:  Are you having pain? Yes: NPRS scale: 1-2/10 Pain location: Back of the L shoulder, into the front of the L shoulder Pain description: Dull and achy,  Aggravating factors: Overhead movements, lifting activities Relieving factors: Ceasing movements, icing, chiropractor  Least pain: 2/10 sitting, Worst pain: 4/10 with activity  No significant change in physical activity to contribute to recent flare up.     PRECAUTIONS: None   RED FLAGS: None      WEIGHT BEARING RESTRICTIONS: No   FALLS:  Has patient fallen in last 6 months? 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    UPPER EXTREMITY ROM: All AROM grossly WFL, mild deficits w/ L shoulder IR AROM.    UPPER EXTREMITY MMT:   MMT Right eval Left eval  Shoulder flexion 4+ 4  Shoulder extension 4+ 4-  Shoulder abduction 4+ 4-  Shoulder adduction      Shoulder internal rotation 4+ 4-  Shoulder external rotation 4+ 4  Middle trapezius 4- 3+  Lower trapezius 4- 3+  Elbow flexion 4+ 4+  Elbow extension      Wrist flexion      Wrist extension      Wrist ulnar deviation      Wrist radial deviation      Wrist pronation      Wrist supination      Grip strength (lbs) 5 4+  (Blank rows = not tested)   CERVICAL SPECIAL TESTS: Spurling's: Negative    SHOULDER SPECIAL TESTS: Impingement tests: Hawkins/Kennedy impingement test: positive  Rotator cuff assessment: Drop arm test: negative, Empty can test: negative, and Full can test: positive    JOINT MOBILITY TESTING:  No notable hypomobility noted in L shoulder w/ A<>P and P <> A joint mobilizations.    PALPATION:  To be assessed at  next visit.              TODAY'S TREATMENT: DATE: 10/08/23   Beginning of session spent reassessing pt's goals and POC to complete re-cert. (See below)    There- ex:  UBE x2 min forward, x2 min backward lvl 5.0 for UE strengthening and mobility Standing High Shoulder Row with Anchored Resistance x10 w/ blue TB Low Trap Setting at Wall x10 w/ 3# DB in bilat UE 's  Standing Bent Over Single Arm Scapular Row x10 w/ 10# DB, x10 w/ silver TB  PATIENT EDUCATION: Education details: HEP given to pt  Person educated: [name]  Education method: Software engineer Education comprehension: verbalized understanding and returned demonstration   HOME EXERCISE PROGRAM:  Access Code: [email or web address] URL: [email or web address] Date: 10/08/2023 Prepared by: Ronnie Derby  Exercises - Standing Bicep Curls Supinated with Dumbbells  - 1 x daily - 3-4 x weekly - 3 sets - 8 reps - Shoulder Flexion Serratus Activation with Resistance  - 1 x daily - 3-4 x weekly - 3 sets - 8 reps - Supine Scapular Protraction in Flexion with Dumbbells  - 1 x daily - 3-4 x weekly - 3 sets - 12 reps - Standing Median Nerve Glide  - 1 x daily - 7 x weekly - 2 sets - 10 reps - Low Trap Setting at Wall  - 1 x daily - 3-4 x weekly - 3 sets - 12 reps - Standing Shoulder Row with Anchored Resistance  - 1 x daily - 3-4 x weekly - 3 sets - 12 reps - Standing High Shoulder Row with Anchored Resistance  - 1 x daily - 3-4 x weekly - 3 sets - 12 reps - Standing Bent Over Single Arm Scapular Row with Table Support  - 1 x daily - 3-4 x weekly - 3 sets - 12 reps - Doorway Pec Stretch at 90 Degrees Abduction  - 1 x daily - 7 x weekly - 2 sets - 10 reps   ASSESSMENT:   CLINICAL IMPRESSION:  Session focused on reviewing pt's STG and LTG's as pt requires re-certification or discharge for today's session. Pt shows significant progression achieving 5/5 of her goals including being HEP compliant, exceeding her FOTO score, decreasing her overall pain levels, along with no pain following prolonged driving and sleeping on her L shoulder. Pt has been able to resume her [employer] upper body exercises pain-free, and has had ease of completing her ADL's. PT and pt agree that pt can continue to make functional L shoulder gains independently at home, and pt will discharge at today's session. Pt's HEP updated and pt demonstrated/ verbalized understanding of updated HEP. Pt discharged.   OBJECTIVE IMPAIRMENTS: decreased ROM, decreased strength, and impaired UE functional use.    ACTIVITY  LIMITATIONS: carrying, lifting, and reach over head   PARTICIPATION LIMITATIONS: cleaning, driving, and community activity   PERSONAL FACTORS: Age, Fitness, and Time since onset of injury/illness/exacerbation are also affecting patient's functional outcome.    REHAB POTENTIAL: Good   CLINICAL DECISION MAKING: Stable/uncomplicated   EVALUATION COMPLEXITY: Low     GOALS: Goals reviewed with patient? Yes   SHORT TERM GOALS: Target date: 08/21/23   Pt will be independent with HEP to improve L shoulder strength and decrease pain with functional activities  Baseline: 07/31/23: HEP given to pt 09/09/23: HEP compliance Goal status: MET   LONG TERM GOALS: Target date: 10/07/23   Pt will improve FOTO to target score to demonstrate clinically significant  improvement in functional mobility.  Baseline: 07/31/23: 69/72 09/09/23: 75/72 10/08/23: 83/72 Goal status: MET   2.  Pt will decrease pain to <2/10 to demonstrate clinically significant improvement in pain levels with functional activity and ADL's.  Baseline: 07/31/23: 4/10 09/09/23: 1-2/10  Goal status: MET   3.  Pt will self report the ability to utilize her LUE to drive >09 min, w/ <8/11 pain, to demonstrate improvements with ability to complete work related activities.  Baseline: 07/31/23: Cannot drive w/ LUE for >91 minutes w/o 4/10 pain. 09/09/23: Able to drive >47 minutes w/o pain  Goal status: MET  4. Pt will self report the ability to sleep w/ her left arm positioned to pt comfort, w/o N/T sx, to demonstrate improvements with ability to improve sleep quality.  Baseline: 09/09/23: N/T with LUE positioned underneath pillow preventing sleeping in that position 10/08/23: Pt able to sleep to comfort Goal status: MET   PLAN:   PT FREQUENCY: 1x/week   PT DURATION: 1 week   PLANNED INTERVENTIONS: Therapeutic exercises, Therapeutic activity, Neuromuscular re-education, Balance training, Gait training, Patient/Family education, Self Care,  Joint mobilization, Joint manipulation, Dry Needling, Electrical stimulation, Spinal manipulation, Spinal mobilization, Cryotherapy, Moist heat, Traction, and Manual therapy   PLAN FOR NEXT SESSION: Pt discharged today.   Lovie Macadamia, SPT  Delphia Grates. Fairly IV, PT, DPT Physical Therapist- [place]  [facility] 10/08/23, 1:06 PM

## 2023-10-09 ENCOUNTER — Ambulatory Visit
Admission: RE | Admit: 2023-10-09 | Discharge: 2023-10-09 | Disposition: A | Discharge status: Home / Self Care | Payer: BC Managed Care – PPO | Source: Ambulatory Visit | Attending: Family Medicine | Admitting: Family Medicine

## 2023-10-09 DIAGNOSIS — Z1231 Encounter for screening mammogram for malignant neoplasm of breast: Secondary | ICD-10-CM | POA: Diagnosis not present

## 2023-11-03 DIAGNOSIS — M542 Cervicalgia: Secondary | ICD-10-CM | POA: Diagnosis not present

## 2023-11-03 DIAGNOSIS — G43719 Chronic migraine without aura, intractable, without status migrainosus: Secondary | ICD-10-CM | POA: Diagnosis not present

## 2023-12-02 DIAGNOSIS — M542 Cervicalgia: Secondary | ICD-10-CM | POA: Diagnosis not present

## 2023-12-02 DIAGNOSIS — G43719 Chronic migraine without aura, intractable, without status migrainosus: Secondary | ICD-10-CM | POA: Diagnosis not present

## 2023-12-29 DIAGNOSIS — M542 Cervicalgia: Secondary | ICD-10-CM | POA: Diagnosis not present

## 2023-12-29 DIAGNOSIS — G43719 Chronic migraine without aura, intractable, without status migrainosus: Secondary | ICD-10-CM | POA: Diagnosis not present

## 2024-01-08 ENCOUNTER — Ambulatory Visit: Payer: BC Managed Care – PPO | Admitting: Family Medicine

## 2024-01-08 ENCOUNTER — Encounter: Payer: Self-pay | Admitting: Family Medicine

## 2024-01-08 VITALS — BP 108/62 | HR 90 | Temp 98.7°F | Ht 61.5 in | Wt 159.6 lb

## 2024-01-08 DIAGNOSIS — G43009 Migraine without aura, not intractable, without status migrainosus: Secondary | ICD-10-CM

## 2024-01-08 MED ORDER — MELOXICAM 15 MG PO TABS: 7.5000 mg | ORAL_TABLET | Freq: Every day | ORAL | 3 refills | Status: DC | PRN

## 2024-01-08 NOTE — Progress Notes (Signed)
She didn't tolerate prev OCP taper with a lower dose in 2024, she had return of HA.  She went back to prev dose and did well for the fall of 2024.   She changed to placebo OCP per her schedule, for the last week.  She didn't have HA onset on the placebo pills until 4 days into the change and that is atypical for patient. Usually with HA onset within 1 day.  Having menses now.    D/w pt about weather changes and recent HA frequency.   She was considering not getting back on her OCPs, at least until she had her period and recent HA.  Discussed options.  We talked about cycling to a new nsaid for prn use.   Meds, vitals, and allergies reviewed.   ROS: Per HPI unless specifically indicated in ROS section   Nad Ncat Neck supple, no LA Rrr Ctab Speech wnl.   30 minutes were devoted to patient care in this encounter (this includes time spent reviewing the patient's file/history, interviewing and examining the patient, counseling/reviewing plan with patient).

## 2024-01-08 NOTE — Patient Instructions (Addendum)
I would stay on placebo pills (ie on schedule) through Saturday.    If you have more headaches, then I would restart the regular tabs.    If you are doing relatively well and not escalating, you could stay off the OCPs.  If you are worse (migraines or hot flashes), then I would restart OCPs at that point.   Please let me know how it goes at the end of next week.   If you restart the pills, then we can see about trying a placebo week every 3 months.    Take care.  Glad to see you.

## 2024-01-09 NOTE — Assessment & Plan Note (Signed)
Expected to have less sx once in menopause, d/w pt.  I see 4 options.  Stay off OCP.  Delay restart OCP.   Restart OCP.   Change to lower dose that she didn't tolerate last year- though she may tolerate better now.   =========== I would stay on placebo pills (ie on schedule) through Saturday and attempt delay restart.    If more headaches over the next few days, then I would restart the regular OCP  If doing relatively well and not escalating, she could stay off the OCPs.  If worse (migraines or hot flashes), then I would restart OCPs at that point.   She can let me know how it goes at the end of next week.   If she is back on OCP, then we can see about trying a placebo week every 3 months.

## 2024-01-26 DIAGNOSIS — G43719 Chronic migraine without aura, intractable, without status migrainosus: Secondary | ICD-10-CM | POA: Diagnosis not present

## 2024-01-26 DIAGNOSIS — M542 Cervicalgia: Secondary | ICD-10-CM | POA: Diagnosis not present

## 2024-02-19 DIAGNOSIS — G43719 Chronic migraine without aura, intractable, without status migrainosus: Secondary | ICD-10-CM | POA: Diagnosis not present

## 2024-02-19 DIAGNOSIS — M542 Cervicalgia: Secondary | ICD-10-CM | POA: Diagnosis not present

## 2024-03-01 ENCOUNTER — Ambulatory Visit: Admitting: Family Medicine

## 2024-03-01 ENCOUNTER — Encounter: Payer: Self-pay | Admitting: Family Medicine

## 2024-03-01 VITALS — BP 106/74 | HR 96 | Temp 98.7°F | Ht 61.5 in | Wt 154.4 lb

## 2024-03-01 DIAGNOSIS — R059 Cough, unspecified: Secondary | ICD-10-CM | POA: Diagnosis not present

## 2024-03-01 DIAGNOSIS — G43009 Migraine without aura, not intractable, without status migrainosus: Secondary | ICD-10-CM

## 2024-03-01 MED ORDER — BENZONATATE 200 MG PO CAPS: 200.0000 mg | ORAL_CAPSULE | Freq: Three times a day (TID) | ORAL | 1 refills | Status: DC | PRN

## 2024-03-01 NOTE — Progress Notes (Unsigned)
 She had high migraine frequently last month, some better this month.  D/w pt about OCPs- when doing better, then we can see about trying a placebo week every 3 months.  She is not ready to make that change yet and I support her decision.  Sx started about 9 days ago.  Was out of town, had rhinorrhea.  Took pseudophed.  Then had congestion/stuffiness.  Started taking mucinex. Then over the next few days, had more rhinorrhea, chills, temp 100.4.  Fatigued and slept more, took advil.  She eventually started feeling better after a few days after the fever broke.  No fevers last few days.  Throat/upper airway irritation with dry cough. Still stuffy but still some congestion.  Had missed some work recently.    Meds, vitals, and allergies reviewed.   ROS: Per HPI unless specifically indicated in ROS section   GEN: nad, alert and oriented HEENT: ncat NECK: supple w/o LA CV: rrr.  PULM: ctab, no inc wob ABD: soft, +bs EXT: no edema SKIN: well perfused.

## 2024-03-01 NOTE — Patient Instructions (Signed)
 You could use tessalon if needed.  Update [place] as needed.  Take care.  Glad to see you.

## 2024-03-03 DIAGNOSIS — R059 Cough, unspecified: Secondary | ICD-10-CM | POA: Insufficient documentation

## 2024-03-03 DIAGNOSIS — H524 Presbyopia: Secondary | ICD-10-CM | POA: Diagnosis not present

## 2024-03-03 DIAGNOSIS — H5213 Myopia, bilateral: Secondary | ICD-10-CM | POA: Diagnosis not present

## 2024-03-03 NOTE — Assessment & Plan Note (Signed)
 Lungs are clear.  Likely postinfectious cough.  Okay for outpatient follow-up. She could use tessalon if needed.  Update [place] as needed.

## 2024-03-03 NOTE — Assessment & Plan Note (Signed)
 She had high migraine frequently last month, some better this month.  D/w pt about OCPs- when doing better, then we can see about trying a placebo week every 3 months.  She is not ready to make that change yet and I support her decision.

## 2024-03-09 ENCOUNTER — Ambulatory Visit: Payer: Self-pay

## 2024-03-11 ENCOUNTER — Ambulatory Visit: Payer: Self-pay

## 2024-03-11 NOTE — Progress Notes (Signed)
 Nutrition: 03/11/2024  CC:  I want to be as healthy as possible and grow older healthy and enjoying life.  Assessment: HT: 5' 1.5"   WT: 154 lb  BMI: 28.7 Gained to 130's pounds in mid-life.  All of her life she has fought with migraine headaches.  Currently on BCP for controlling the hormonal effects on her headaches.  Had fewer in last month. Can have as many as 16 headaches per month.  MD feels like the decrease is due to hormonal changes.  Stops her BCP 2 times per year to check for presence of a period and possible onset of menopause.  Activity:Does 10-20 minutes of strength training 3-5 days per week.  Walks on campus, but no additional walking at home.  Active with housework and gardening at home.  Dietary: Starts the day with water and decaf black coffee at 6:00 am.  8-8:30 at work will have a slice of multi-grain bread with PB , raspberries or blue berries on top.   10:30 black coffee.  Drinks water throughout the day. 11:00 snack of humus and brown rice chips or a fruit or a handful of nuts.  Snack accompanied by water or coffee. 1:15 Lunch is usually leftovers such as whole wheat pasta with ricotta, egg plant and sausage.  Drinks water. 6-6:30 Dinner of grilled pork ribs with roasted brussels sprouts and potatoes. Evening snack of dark chocolate and 2-3 evenings per week Eddie Koc sip on bourbon.  Recommendations: Try to get aerobic exercise in the evening.  Ride the [employer] bike for 2 miles around the neighborhood.  Monitor your serving sizes of pasta/starchy foods. Increase your aerobic exercise. Monitor your intake of sweets and alcohol.  Teaching handouts: Foods and Food Groups for [employer]. Foods and food group serving sizes.  Follow-up: As desired.  Margaret Kairie Vangieson, RN, RD, LDN

## 2024-03-23 DIAGNOSIS — G43719 Chronic migraine without aura, intractable, without status migrainosus: Secondary | ICD-10-CM | POA: Diagnosis not present

## 2024-03-23 DIAGNOSIS — M542 Cervicalgia: Secondary | ICD-10-CM | POA: Diagnosis not present

## 2024-04-20 DIAGNOSIS — G43719 Chronic migraine without aura, intractable, without status migrainosus: Secondary | ICD-10-CM | POA: Diagnosis not present

## 2024-04-20 DIAGNOSIS — M542 Cervicalgia: Secondary | ICD-10-CM | POA: Diagnosis not present

## 2024-05-14 ENCOUNTER — Ambulatory Visit: Payer: Self-pay

## 2024-05-14 NOTE — Telephone Encounter (Signed)
 Chief Complaint:  Back Pain   Symptoms:  - Pain after a new exercise  Onset  1 week ago   Patient denies  -Swelling, redness, knots, numbness, tingling, weakness, bleeding, inability to walk, worsening of pain.  Disposition: [ ] ED /[ ] Urgent Care (no appt availability in office) / [ X]Appointment(In office/virtual)/ [ ]  [place] [facility]/ [ ] Home Care/ [ ] Refused Recommended Disposition /[ ] [place] [facility]/ [ ]  Follow-up with PCP   Additional Notes:  Patient attempted a new exercise that involved russian twist + lunging, pt reported back pain after attempted. Pt has been utilizing multiple interventions ( Advil, heat, side sleeping) and the pain has been steady. Apt scheduled d/t lack of progression of s/s.   Appointment scheduled for patient appropriately.   Patient educated on pertinent s/s that would warrant emergent help/call 911/ ED/UC.  Patient verbalized understanding and agrees with plan . No additional questions/concerns noted during the time of the call.     Complete triage note below:        Copied from CRM [id number]. Topic: Clinical - Red Word Triage >> May 14, 2024  2:44 PM Adrionna Y wrote: Red Word that prompted transfer to Nurse Triage: severe back pain, worsening Reason for Disposition  Caused by a twisting, bending, or lifting injury  Answer Assessment - Initial Assessment Questions 1. ONSET: "When did the pain begin?"      --- two week ago     2. LOCATION: "Where does it hurt?" (upper, mid or lower back)     ----- Low back and top of glutes.    3. SEVERITY: "How bad is the pain?"  (e.g., Scale 1-10; mild, moderate, or severe)   - MILD (1-3): Doesn't interfere with normal activities.    - MODERATE (4-7): Interferes with normal activities or awakens from sleep.    - SEVERE (8-10): Excruciating pain, unable to do any normal activities.      ------------------- Currently, no bad.  Usually has a hard time bending, squatting- With that  4/10    4. PATTERN: "Is the pain constant?" (e.g., yes, no; constant, intermittent)      ----- Intermittent     5. RADIATION: "Does the pain shoot into your legs or somewhere else?"     ------ Denies    6. CAUSE:  "What do you think is causing the back pain?"      ----- Over exerted herself during an exercise.    7. BACK OVERUSE:  "Any recent lifting of heavy objects, strenuous work or exercise?"     -- Exercise    8. MEDICINES: "What have you taken so far for the pain?" (e.g., nothing, acetaminophen , NSAIDS)     -----   9. NEUROLOGIC SYMPTOMS: "Do you have any weakness, numbness, or problems with bowel/bladder control?"     ---------------- Denies    10. OTHER SYMPTOMS: "Do you have any other symptoms?" (e.g., fever, abdomen pain, burning with urination, blood in urine)       ------------- Denies    ---- has been icing,using a heating pad, taking 400mg  of Advil.  Protocols used: Back Pain-A-AH

## 2024-05-14 NOTE — Telephone Encounter (Signed)
 If we can get her in sooner with a same day, please schedule then.  Thanks.

## 2024-05-17 NOTE — Telephone Encounter (Signed)
 Noted. Thanks.

## 2024-05-17 NOTE — Telephone Encounter (Signed)
 I was able to schedule her for 6/3 at 9am

## 2024-05-18 ENCOUNTER — Encounter: Payer: Self-pay | Admitting: Family Medicine

## 2024-05-18 ENCOUNTER — Ambulatory Visit: Admitting: Family Medicine

## 2024-05-18 VITALS — BP 112/82 | HR 82 | Temp 99.0°F | Ht 61.5 in | Wt 156.8 lb

## 2024-05-18 DIAGNOSIS — M545 Low back pain, unspecified: Secondary | ICD-10-CM

## 2024-05-18 DIAGNOSIS — M542 Cervicalgia: Secondary | ICD-10-CM | POA: Diagnosis not present

## 2024-05-18 DIAGNOSIS — G43719 Chronic migraine without aura, intractable, without status migrainosus: Secondary | ICD-10-CM | POA: Diagnosis not present

## 2024-05-18 MED ORDER — TIZANIDINE HCL 2 MG PO TABS: 2.0000 mg | ORAL_TABLET | Freq: Every evening | ORAL | 0 refills | Status: AC | PRN

## 2024-05-18 MED ORDER — DICLOFENAC POTASSIUM 50 MG PO TABS: 50.0000 mg | ORAL_TABLET | Freq: Three times a day (TID) | ORAL | 4 refills | Status: AC | PRN

## 2024-05-18 NOTE — Progress Notes (Unsigned)
 Lower back pain for the last 2 weeks.    Needed refill on cataflam .  She doesn't take it with other nsaids, cautions d/w pt. It helped her HA more than meloxicam .    Restarted her bike riding then did extra strength training.  She was doing lunges with a twist and then felt the pain.  Didn't feel a pop.  No bruising.  Low back pain at that point.  L sided pain now, prev R sided, it switches.  No leg pain.  Pain sitting at work.  Used a heating pad in the meantime.  That helped some.    Recently with L leg numbness with prolonged sitting at her desk chair.  That improved in the meantime.  She thought her desk chair was not helpful with posture, esp when she had aggravated her back.    She is trying ice vs heat.  Today is better than yesterday.  Took ibuprofen in the meantime.   No FCNACD.    She had drowsiness with flexeril  use.  She had the sensation of needing to move her legs with using that med.  It resolved off med.    Meds, vitals, and allergies reviewed.   ROS: Per HPI unless specifically indicated in ROS section   L lower back ttp, not midline. Normal hip ROM.

## 2024-05-18 NOTE — Patient Instructions (Signed)
 I would lay of out exercise for a few days then gradually restart.  Heat/ice/stretch.   I would use tizanidine if needed.  Take care.  Glad to see you.

## 2024-05-19 DIAGNOSIS — M545 Low back pain, unspecified: Secondary | ICD-10-CM | POA: Insufficient documentation

## 2024-05-19 NOTE — Assessment & Plan Note (Signed)
 Presumed muscle strain. I would lay of out exercise for a few days then gradually restart.  Heat/ice/stretch as needed in the meantime. I would use tizanidine if needed.  She can update me as needed.  Okay to defer imaging.

## 2024-05-20 ENCOUNTER — Ambulatory Visit: Admitting: Family Medicine

## 2024-05-24 ENCOUNTER — Ambulatory Visit: Payer: Self-pay

## 2024-05-24 NOTE — Telephone Encounter (Signed)
 FYI Only or Action Required?: Action required by provider  Patient was last seen in primary care on 05/18/2024 by Donnie Galea, MD. Called Nurse Triage reporting Back Pain. Symptoms began today. Interventions attempted: OTC medications: OTC pain reliever and Ice/heat application. Symptoms are: gradually worsening.  Triage Disposition: See PCP When Office is Open (Within 3 Days)  Patient/caregiver understands and will follow disposition?: No, wishes to speak with PCP                Copied From CRM [id number]. Reason for Triage: being treated for muscle strain, was feeling better but after exercise this morning having new pain and tightness in left hip and back- also need dosage instruction for muscle relaxers [phone]   Reason for Disposition  [1] MODERATE back pain (e.g., interferes with normal activities) AND [2] present > 3 days  Answer Assessment - Initial Assessment Questions 1. ONSET: "When did the pain begin?"      This AM, after exercise 2. LOCATION: "Where does it hurt?" (upper, mid or lower back)     Left hip/back pain 3. SEVERITY: "How bad is the pain?"  (e.g., Scale 1-10; mild, moderate, or severe)   - MILD (1-3): Doesn't interfere with normal activities.    - MODERATE (4-7): Interferes with normal activities or awakens from sleep.    - SEVERE (8-10): Excruciating pain, unable to do any normal activities.      4/10 after using heating pad and OTC pain reliever 4. PATTERN: "Is the pain constant?" (e.g., yes, no; constant, intermittent)      Constant  5. RADIATION: "Does the pain shoot into your legs or somewhere else?"     Left hip  6. CAUSE:  "What do you think is causing the back pain?"      Muscle Strain 7. BACK OVERUSE:  "Any recent lifting of heavy objects, strenuous work or exercise?"     Exercise this AM  9. NEUROLOGIC SYMPTOMS: "Do you have any weakness, numbness, or problems with bowel/bladder control?"     None 10. OTHER SYMPTOMS: "Do you have  any other symptoms?" (e.g., fever, abdomen pain, burning with urination, blood in urine)       None   Pt declines appt, requests Dr. Harrel Lim recommendations regarding treatment with new increasing pain  Protocols used: Back Pain-A-AH

## 2024-05-24 NOTE — Telephone Encounter (Signed)
 Called pt.  She was improving over the weekend after taking tizanidine .  Last used med last night.    She tried brief exercise this AM, no pain then but sore after that.  She got better, then had more pain at work.  Pain was worse after sitting and resting.  No back pain but pain at the anterior L hip. No bruising.  Her back is still better.   She used heat and advil today.  That helped some.    It sounds like she had a spasm after exercise.  D/w pt about stretching gently, staying hydrated, ice vs heat.  D/w pt about potentially using tizanidine  q8h, sedation caution.  She can update me as needed.  Relative rest in the meantime. She agreed.

## 2024-05-25 ENCOUNTER — Other Ambulatory Visit: Payer: Self-pay | Admitting: Specialist

## 2024-05-25 ENCOUNTER — Encounter: Payer: Self-pay | Admitting: Specialist

## 2024-05-25 DIAGNOSIS — M5481 Occipital neuralgia: Secondary | ICD-10-CM

## 2024-06-01 ENCOUNTER — Ambulatory Visit
Admission: RE | Admit: 2024-06-01 | Discharge: 2024-06-01 | Disposition: A | Discharge status: Home / Self Care | Source: Ambulatory Visit | Attending: Specialist | Admitting: Specialist

## 2024-06-01 DIAGNOSIS — M5481 Occipital neuralgia: Secondary | ICD-10-CM | POA: Diagnosis not present

## 2024-06-02 ENCOUNTER — Other Ambulatory Visit: Payer: Self-pay | Admitting: Family Medicine

## 2024-06-02 DIAGNOSIS — R7989 Other specified abnormal findings of blood chemistry: Secondary | ICD-10-CM

## 2024-06-03 ENCOUNTER — Ambulatory Visit: Payer: Self-pay | Admitting: Family Medicine

## 2024-06-03 ENCOUNTER — Other Ambulatory Visit (INDEPENDENT_AMBULATORY_CARE_PROVIDER_SITE_OTHER): Payer: BC Managed Care – PPO

## 2024-06-03 DIAGNOSIS — R7989 Other specified abnormal findings of blood chemistry: Secondary | ICD-10-CM

## 2024-06-03 LAB — COMPREHENSIVE METABOLIC PANEL WITH GFR
ALT: 17 U/L (ref 0–35)
AST: 19 U/L (ref 0–37)
Albumin: 4.3 g/dL (ref 3.5–5.2)
Alkaline Phosphatase: 34 U/L — ABNORMAL LOW (ref 39–117)
BUN: 11 mg/dL (ref 6–23)
CO2: 28 meq/L (ref 19–32)
Calcium: 9.9 mg/dL (ref 8.4–10.5)
Chloride: 98 meq/L (ref 96–112)
Creatinine, Ser: 0.73 mg/dL (ref 0.40–1.20)
GFR: 91.22 mL/min (ref >60.00)
Glucose, Bld: 84 mg/dL (ref 70–99)
Potassium: 4.1 meq/L (ref 3.5–5.1)
Sodium: 133 meq/L — ABNORMAL LOW (ref 135–145)
Total Bilirubin: 0.6 mg/dL (ref 0.2–1.2)
Total Protein: 7.2 g/dL (ref 6.0–8.3)

## 2024-06-03 LAB — LIPID PANEL
Cholesterol: 221 mg/dL — ABNORMAL HIGH (ref 0–200)
HDL: 85.9 mg/dL (ref >39.00)
LDL Cholesterol: 115 mg/dL — ABNORMAL HIGH (ref 0–99)
NonHDL: 135.08
Total CHOL/HDL Ratio: 3
Triglycerides: 100 mg/dL (ref 0.0–149.0)
VLDL: 20 mg/dL (ref 0.0–40.0)

## 2024-06-10 ENCOUNTER — Ambulatory Visit (INDEPENDENT_AMBULATORY_CARE_PROVIDER_SITE_OTHER): Payer: BC Managed Care – PPO | Admitting: Family Medicine

## 2024-06-10 ENCOUNTER — Encounter: Payer: Self-pay | Admitting: Family Medicine

## 2024-06-10 VITALS — BP 116/84 | HR 77 | Temp 98.9°F | Ht 61.5 in | Wt 157.2 lb

## 2024-06-10 DIAGNOSIS — Z0000 Encounter for general adult medical examination without abnormal findings: Secondary | ICD-10-CM | POA: Diagnosis not present

## 2024-06-10 DIAGNOSIS — G43009 Migraine without aura, not intractable, without status migrainosus: Secondary | ICD-10-CM

## 2024-06-10 DIAGNOSIS — Z7189 Other specified counseling: Secondary | ICD-10-CM

## 2024-06-10 MED ORDER — ENSKYCE 0.15-30 MG-MCG PO TABS: 1.0000 | ORAL_TABLET | Freq: Every day | ORAL | 3 refills | Status: AC

## 2024-06-10 NOTE — Patient Instructions (Signed)
 Don't change your meds for now.  Take care.  Glad to see you. I would get a flu shot each fall.   Check with your insurance to see if they will cover the shingles shot.

## 2024-06-10 NOTE — Progress Notes (Signed)
 CPE- See plan.  Routine anticipatory guidance given to patient.  See health maintenance.  The possibility exists that previously documented standard health maintenance information may have been brought forward from a previous encounter into this note.  If needed, that same information has been updated to reflect the current situation based on today's encounter.    Tetanus 2023 Flu prev done.   covid 2021 PNA d/w pt.   Shingles d/w pt.   Colonoscopy 06/25/17 with 10 year f/u.   Mammogram 2024 Living will d/w pt.  Husband designated if patient were incapacitated.  DXA- done prev per ortho.   Diet and exercise d/w pt.  she is working on both.   [facility], [facility] since [facility].   Pap 2022  Migraines.  Still on OCPs, she is due for period next month.  Having mult migraines per month with neuro f/u pending.   Hip and back pain are better.    Was walking barefoot at home.  Hit R3rd-5th toes.  4th toe was puffy but she could still walk.  Iced at the time.  It improved in the meantime. Back in regular shoes.  Bruising resolved.  Not puffy now.    PMH and SH reviewed  Meds, vitals, and allergies reviewed.   ROS: Per HPI.  Unless specifically indicated otherwise in HPI, the patient denies:  General: fever. Eyes: acute vision changes ENT: sore throat Cardiovascular: chest pain Respiratory: SOB GI: vomiting GU: dysuria Musculoskeletal: acute back pain Derm: acute rash Neuro: acute motor dysfunction Psych: worsening mood Endocrine: polydipsia Heme: bleeding Allergy: hayfever  GEN: nad, alert and oriented HEENT: ncat NECK: supple w/o LA CV: rrr. PULM: ctab, no inc wob ABD: soft, +bs EXT: no ankle edema SKIN: no acute rash  The 10-year ASCVD risk score (Arnett DK, et al., 2019) is: 1.5%   Values used to calculate the score:     Age: 58 years     Clincally relevant sex: Female     Is Non-Hispanic African American: No     Diabetic: No      Tobacco smoker: No     Systolic Blood Pressure: 116 mmHg     Is BP treated: No     HDL Cholesterol: 85.9 mg/dL     Total Cholesterol: 221 mg/dL  Recent labs discussed with patient.  Low ASCVD risk.

## 2024-06-13 NOTE — Assessment & Plan Note (Signed)
 Tetanus 2023 Flu prev done.   covid 2021 PNA d/w pt.   Shingles d/w pt.   Colonoscopy 06/25/17 with 10 year f/u.   Mammogram 2024 Living will d/w pt.  Husband designated if patient were incapacitated.  DXA- done prev per ortho.   Diet and exercise d/w pt.  she is working on both.   [facility], [facility] since [facility].   Pap 2022

## 2024-06-13 NOTE — Assessment & Plan Note (Signed)
Living will d/w pt.  Husband designated if patient were incapacitated.  

## 2024-06-13 NOTE — Assessment & Plan Note (Signed)
 Still on OCPs, she is due for period next month.  Having mult migraines per month with neuro f/u pending.  Discussed that we can consider attempting tapering her OCPs next year.

## 2024-06-21 DIAGNOSIS — G43719 Chronic migraine without aura, intractable, without status migrainosus: Secondary | ICD-10-CM | POA: Diagnosis not present

## 2024-06-21 DIAGNOSIS — M542 Cervicalgia: Secondary | ICD-10-CM | POA: Diagnosis not present

## 2024-06-28 ENCOUNTER — Encounter: Payer: Self-pay | Admitting: Emergency Medicine

## 2024-06-28 ENCOUNTER — Ambulatory Visit
Admission: EM | Admit: 2024-06-28 | Discharge: 2024-06-28 | Disposition: A | Discharge status: Home / Self Care | Attending: Emergency Medicine | Admitting: Emergency Medicine

## 2024-06-28 ENCOUNTER — Ambulatory Visit: Payer: Self-pay

## 2024-06-28 DIAGNOSIS — G43011 Migraine without aura, intractable, with status migrainosus: Secondary | ICD-10-CM

## 2024-06-28 MED ORDER — DEXAMETHASONE SODIUM PHOSPHATE 10 MG/ML IJ SOLN
10.0000 mg | Freq: Once | INTRAMUSCULAR | Status: AC
  Administered 2024-06-28: 10 mg via INTRAMUSCULAR

## 2024-06-28 MED ORDER — KETOROLAC TROMETHAMINE 30 MG/ML IJ SOLN
30.0000 mg | Freq: Once | INTRAMUSCULAR | Status: AC
  Administered 2024-06-28: 30 mg via INTRAMUSCULAR

## 2024-06-28 MED ORDER — ONDANSETRON HCL 4 MG/2ML IJ SOLN
4.0000 mg | Freq: Once | INTRAMUSCULAR | Status: AC
  Administered 2024-06-28: 4 mg via INTRAMUSCULAR

## 2024-06-28 NOTE — ED Provider Notes (Signed)
 [facility]    CSN: [id number] Arrival date & time: 06/28/24  1834      History   Chief Complaint Chief Complaint  Patient presents with   Migraine    HPI Margaret Brewer is a 58 y.o. female.   Patient presents for evaluation of a intermittent frontal headache beginning 5 days ago.  Flared today around 12 noon and has been more severe than prior.  Associated noise sensitivity, nausea without vomiting.  History of migraines, has attempted Zomig  which has been ineffective.  Denies dizziness, lightheadedness, head injury, visual changes.  Past Medical History:  Diagnosis Date   Hyperlipidemia    with high HDL   Metatarsal fracture    Migraine without aura    varies with hormonal changes and weather changes    Patient Active Problem List   Diagnosis Date Noted   Hand paresthesia 07/27/2023   Left foot pain 07/16/2020   Advance care planning 03/09/2015   Routine general medical examination at a health care facility 02/13/2012   Migraine without aura 12/29/2007    Past Surgical History:  Procedure Laterality Date   CHOLECYSTECTOMY  01/14/2002   Dr. Dellie   ESOPHAGOGASTRODUODENOSCOPY (EGD) WITH PROPOFOL  N/A 07/07/2015   Procedure: ESOPHAGOGASTRODUODENOSCOPY (EGD) WITH PROPOFOL ;  Surgeon: Gladis RAYMOND Mariner, MD;  Location: [facility] ENDOSCOPY;  Service: Endoscopy;  Laterality: N/A;   OOPHORECTOMY  04/1986   Ovarian cyst, unilateral oophorectomy   TONSILLECTOMY     As a child    OB History   No obstetric history on file.      Home Medications    Prior to Admission medications   Medication Sig Start Date End Date Taking? Authorizing Provider  Calcium Carb-Cholecalciferol 600-400 MG-UNIT TABS Take 1 tablet by mouth daily.    [provider]  desogestrel -ethinyl estradiol  (ENSKYCE ) 0.15-30 MG-MCG tablet Take 1 tablet by mouth daily. 06/10/24   Cleatus Arlyss GORMAN, MD  diclofenac  (CATAFLAM ) 50 MG tablet Take 1 tablet (50 mg total) by mouth 3 (three)  times daily as needed. with food. Don't take with other nsaids. 05/18/24   Cleatus Arlyss GORMAN, MD  EMGALITY 120 MG/ML SOAJ SMARTSIG:150 Milligram(s) SUB-Q Once a Month 04/21/23   [provider]  fish oil-omega-3 fatty acids 1000 MG capsule Take 1 g by mouth daily.    [provider]  Menaquinone-7 (VITAMIN K2 PO) Take 1 tablet by mouth daily.    [provider]  Multiple Vitamin (MULTIVITAMIN) tablet Take 1 tablet by mouth daily.    [provider]  tiZANidine  (ZANAFLEX ) 2 MG tablet Take 1 tablet (2 mg total) by mouth at bedtime as needed for muscle spasms. Patient not taking: Reported on 06/10/2024 05/18/24   Cleatus Arlyss GORMAN, MD  vitamin C (ASCORBIC ACID) 500 MG tablet Take 500 mg by mouth 2 (two) times daily.    [provider]  zolmitriptan  (ZOMIG ) 5 MG tablet Take 1 tablet (5 mg total) by mouth as needed for migraine (max doses in 24 hours.). 02/05/19   Cleatus Arlyss GORMAN, MD    Family History Family History  Problem Relation Age of Onset   Osteoporosis Mother    Osteoporosis Other    Heart disease Other    Hypertension Other    Stroke Other        X 2   Diabetes Neg Hx    Colon cancer Neg Hx    Breast cancer Neg Hx     Social History Social History   Tobacco Use  Smoking status: Never   Smokeless tobacco: Never  Substance Use Topics   Alcohol use: Yes    Alcohol/week: 0.0 standard drinks of alcohol    Comment: occ, wine   Drug use: No     Allergies   Cephalexin, Erythromycin, Nitrofurantoin, Omeprazole, Penicillins, Sulfonamide derivatives, and Vicks vaporub [[id number]-eucalypt-men-turp-pet]   Review of Systems Review of Systems   Physical Exam Triage Vital Signs ED Triage Vitals  Encounter Vitals Group     BP 06/28/24 1838 138/79     Girls Systolic BP Percentile --      Girls Diastolic BP Percentile --      Boys Systolic BP Percentile --      Boys Diastolic BP Percentile --      Pulse Rate 06/28/24 1838 81     Resp  06/28/24 1838 18     Temp 06/28/24 1838 98.5 F (36.9 C)     Temp Source 06/28/24 1838 Oral     SpO2 06/28/24 1838 96 %     Weight --      Height --      Head Circumference --      Peak Flow --      Pain Score 06/28/24 1842 8     Pain Loc --      Pain Education --      Exclude from Growth Chart --    No data found.  Updated Vital Signs BP 138/79 (BP Location: Left Arm)   Pulse 81   Temp 98.5 F (36.9 C) (Oral)   Resp 18   LMP  (LMP Unknown)   SpO2 96%   Visual Acuity Right Eye Distance:   Left Eye Distance:   Bilateral Distance:    Right Eye Near:   Left Eye Near:    Bilateral Near:     Physical Exam Constitutional:      Appearance: Normal appearance.  Eyes:     Extraocular Movements: Extraocular movements intact.     Conjunctiva/sclera: Conjunctivae normal.     Pupils: Pupils are equal, round, and reactive to light.  Pulmonary:     Effort: Pulmonary effort is normal.  Neurological:     General: No focal deficit present.     Mental Status: She is alert and oriented to person, place, and time. Mental status is at baseline.     Cranial Nerves: No cranial nerve deficit.     Motor: No weakness.     Gait: Gait normal.      UC Treatments / Results  Labs (all labs ordered are listed, but only abnormal results are displayed) Labs Reviewed - No data to display  EKG   Radiology No results found.  Procedures Procedures (including critical care time)  Medications Ordered in UC Medications  ketorolac  (TORADOL ) 30 MG/ML injection 30 mg (30 mg Intramuscular Given 06/28/24 1910)  dexamethasone  (DECADRON ) injection 10 mg (10 mg Intramuscular Given 06/28/24 1910)  ondansetron  (ZOFRAN ) injection 4 mg (4 mg Intramuscular Given 06/28/24 1910)    Initial Impression / Assessment and Plan / UC Course  I have reviewed the triage vital signs and the nursing notes.  Pertinent labs & imaging results that were available during my care of the patient were reviewed by me  and considered in my medical decision making (see chart for details).  Intractable migraine without aura with status migrainosus  No neurological deficits, stable for outpatient management, Toradol  Zofran  and Decadron  IM given, recommended nonpharmacological supportive care, for worsening headache advised emergency department evaluation for  persisting headaches may follow-up with primary doctor Final Clinical Impressions(s) / UC Diagnoses   Final diagnoses:  Intractable migraine without aura and with status migrainosus     Discharge Instructions      For your headache -On exam there are no abnormalities neurologically -You have been given an injection of Toradol , Zofran  and Decadron  here in the office today to help minimize your symptoms -While headaches are present ensure that you are getting adequate rest and adequate fluid intake -Participate in low stimulation activities avoiding bright lights and loud noises when symptoms are present -If your headaches continue to persist please follow-up with your primary doctor for reevaluation -At any point if you have the worst headache of your life please go to the nearest emergency department for immediate evaluation    ED Prescriptions   None    PDMP not reviewed this encounter.   Teresa Shelba SAUNDERS, NP 06/28/24 1927

## 2024-06-28 NOTE — Telephone Encounter (Signed)
 FYI Only or Action Required?: FYI only for provider.  Patient was last seen in primary care on 06/10/2024 by Margaret Arlyss RAMAN, MD.  Called Nurse Triage reporting Migraine.  Symptoms began several days ago.  Interventions attempted: Prescription medications: Zomig .  Symptoms are: gradually worsening.  Triage Disposition: See HCP Within 4 Hours (Or PCP Triage)  Patient/caregiver understands and will follow disposition?: Yes    Copied from CRM [id number]. Topic: Clinical - Red Word Triage >> Jun 28, 2024  6:00 PM Margaret Brewer wrote: [facility] that prompted transfer to Nurse Triage: Patient has a migraine for 3 days, rating 8/10. She cannot take any additional zolmitriptan  (ZOMIG ) 5 MG tablet due to taking it Saturday and Sunday. Reason for Disposition  [1] SEVERE headache (e.g., excruciating) AND [2] not improved after 2 hours of pain medicine  Answer Assessment - Initial Assessment Questions Denies head injury, neck stiffness, cold/flu, chest pain   1. LOCATION: Where does it hurt?      This current migraine is going back and forth between eyes 2. ONSET: When did the headache start? (e.g., minutes, hours, days)      Today around 12-1 3. PATTERN: Does the pain come and go, or has it been constant since it started?     Constant since today around 12-1 4. SEVERITY: How bad is the pain? and What does it keep you from doing?  (e.g., Scale 1-10; mild, moderate, or severe)     8/10 5. RECURRENT SYMPTOM: Have you ever had headaches before? If Yes, ask: When was the last time? and What happened that time?      Yes 6. CAUSE: What do you think is causing the headache?     Patient feels this is a Migraine 7. MIGRAINE: Have you been diagnosed with migraine headaches? If Yes, ask: Is this headache similar?      Yes - this feels worse 8. HEAD INJURY: Has there been any recent injury to your head?      No 9. OTHER SYMPTOMS: Do you have any other symptoms? (e.g., fever, stiff  neck, eye pain, sore throat, cold symptoms)     Nausea, eye pain  Protocols used: Headache-A-AH

## 2024-06-28 NOTE — Discharge Instructions (Signed)
 For your headache -On exam there are no abnormalities neurologically -You have been given an injection of Toradol , Zofran  and Decadron  here in the office today to help minimize your symptoms -While headaches are present ensure that you are getting adequate rest and adequate fluid intake -Participate in low stimulation activities avoiding bright lights and loud noises when symptoms are present -If your headaches continue to persist please follow-up with your primary doctor for reevaluation -At any point if you have the worst headache of your life please go to the nearest emergency department for immediate evaluation

## 2024-06-28 NOTE — ED Triage Notes (Signed)
 Patient complains of migraine head ache that started at 12 noon. Also complains of nausea. Reports she took 800 mg Advil about 11:50 am. Rates pain 8/10.

## 2024-06-29 NOTE — Telephone Encounter (Signed)
 Patient states symptoms have improved. She states the nausea has resolved. Migraine is returning today at the base of her neck, 3/10. She states she iced her head/neck during her lunch break today. She is scheduled for July 17th appointment with PCP. She states she would like to know what else she can take to help with the migraine. She is asking if PCP can continue the steroid the urgent care gave her or if she should go back to urgent care. She states already took 3 doses of Zomig  (Thursday, Saturday and Sunday). She states she is just concerned that her migraine will worsen and she would like to know what she can do to get her thru til her appointment with PCP.

## 2024-06-29 NOTE — Telephone Encounter (Signed)
 Noted. Thanks. Please get update on patient later today.

## 2024-06-29 NOTE — Telephone Encounter (Signed)
   Patient triaged within the past 24 hours, duplicate CRM/encounter. See triage encounter yesterday for updates.   Copied from CRM [id number]. Topic: Clinical - Red Word Triage >> Jun 29, 2024  3:53 PM Lavanda D wrote: Red Word that prompted transfer to Nurse Triage: Severe Migraine/Nausea: Patient was triaged this morning and advised to go to urgent care. She left a message this morning for Dr. Cleatus for a call back to assist with her nausea/pain and has not yet heard back. She is still experiencing the nausea and migraines.This encounter was created in error - please disregard.

## 2024-06-29 NOTE — Telephone Encounter (Signed)
 Copied from CRM [id number]. Topic: Clinical - Medical Advice >> Jun 29, 2024  9:18 AM Chasity T wrote: Reason for CRM: Patient is calling in to speak with PCP nurse regarding on going headache she has been having. State she went to urgent care on last night and feels like it is coming back. Also with nausea and stomach pain. Patient is asking to return her call at her cell number on file.

## 2024-06-30 ENCOUNTER — Other Ambulatory Visit: Payer: Self-pay | Admitting: Family Medicine

## 2024-06-30 MED ORDER — PREDNISONE 10 MG PO TABS: ORAL_TABLET | ORAL | 0 refills | Status: AC

## 2024-06-30 MED ORDER — PREDNISONE 10 MG PO TABS: ORAL_TABLET | ORAL | Status: DC

## 2024-06-30 NOTE — Addendum Note (Signed)
 Addended by: CLEATUS ARLYSS RAMAN on: 06/30/2024 02:04 PM   Modules accepted: Orders

## 2024-06-30 NOTE — Telephone Encounter (Signed)
 Left voicemail for patient to return call to office.

## 2024-06-30 NOTE — Telephone Encounter (Signed)
 Please let her know that I sent a prescription for prednisone .  I would not take diclofenac  or any other NSAIDs while she is taking prednisone .  Take it with food.  I sent it for 10 mg tabs.  She can start with 40 mg a day but then taper down over the next few days.  If she is clearly better each day, she could taper more quickly.  Initial Rx is for 40 mg daily for 2 days, 30 mg daily for 2 days, 20 mg daily for 2 days, 10 mg daily for 2 days.  She could cut back by 10 mg a day and taper over 4 days if she is doing better.    I hope she feels better soon.  Thanks.

## 2024-07-01 ENCOUNTER — Encounter: Payer: Self-pay | Admitting: Family Medicine

## 2024-07-01 ENCOUNTER — Ambulatory Visit: Admitting: Family Medicine

## 2024-07-01 VITALS — BP 116/64 | HR 71 | Temp 98.9°F | Ht 61.5 in | Wt 156.0 lb

## 2024-07-01 DIAGNOSIS — G43009 Migraine without aura, not intractable, without status migrainosus: Secondary | ICD-10-CM | POA: Diagnosis not present

## 2024-07-01 MED ORDER — ONDANSETRON HCL 4 MG PO TABS: 4.0000 mg | ORAL_TABLET | Freq: Three times a day (TID) | ORAL | 1 refills | Status: AC | PRN

## 2024-07-01 NOTE — Progress Notes (Signed)
 Recent migraine.  Seen at UC, given Toradol  Zofran  and Decadron  IM.  This HA had more nausea than typical.  She was having a good day until the HA started.  Sig weather changes recently, that is a typical trigger for patient.  Also had a period last week which is a typical trigger for patient.  She isn't on prednisone  yet.  Has rx, held so far.   D/w pt about having zofran  on hand if needed, in the future.  Rx sent.  D/w pt about UC visit- she got good care at the visit and she appreciated that.   Meds, vitals, and allergies reviewed.   ROS: Per HPI unless specifically indicated in ROS section   GEN: nad, alert and oriented HEENT: ncat NECK: supple w/o LA CV: rrr PULM: ctab, no inc wob ABD: soft, +bs EXT: no edema SKIN: no acute rash but small bruise at R deltoid at previous injection site.

## 2024-07-01 NOTE — Patient Instructions (Addendum)
 Use zofran  if needed, along with routine meds at headache onset.   I would take 20mg  prednisone  now.  You could take another 20mg  soon after lunch if needed.   If doing well, then skip afternoon dose.    I would repeat your dose tomorrow, then taper from there on Saturday by 10mg .    Then when done with prednisone , then use the diclofenac  if needed.   Take care.  Glad to see you.

## 2024-07-01 NOTE — Telephone Encounter (Signed)
 Patient in office today

## 2024-07-02 NOTE — Assessment & Plan Note (Signed)
 Discussed options.  She has improved since presentation at urgent care.  She took 20 mg of prednisone  at the office visit.  She can take another 20 mg of prednisone  later today if needed.  She could repeat the 40 mg of prednisone  tomorrow.  If she is already doing better she could taper more quickly, from 20 mg down to 10 mg and then stop.  Would hold other NSAIDs while taking prednisone .  Prescription sent for Zofran , to use as needed/in the future.  She can update me as needed.

## 2024-07-19 DIAGNOSIS — G43719 Chronic migraine without aura, intractable, without status migrainosus: Secondary | ICD-10-CM | POA: Diagnosis not present

## 2024-07-19 DIAGNOSIS — M542 Cervicalgia: Secondary | ICD-10-CM | POA: Diagnosis not present

## 2024-08-17 DIAGNOSIS — M542 Cervicalgia: Secondary | ICD-10-CM | POA: Diagnosis not present

## 2024-08-17 DIAGNOSIS — G43719 Chronic migraine without aura, intractable, without status migrainosus: Secondary | ICD-10-CM | POA: Diagnosis not present

## 2024-08-26 ENCOUNTER — Other Ambulatory Visit: Payer: Self-pay | Admitting: Family Medicine

## 2024-08-26 DIAGNOSIS — Z1231 Encounter for screening mammogram for malignant neoplasm of breast: Secondary | ICD-10-CM

## 2024-09-14 DIAGNOSIS — M542 Cervicalgia: Secondary | ICD-10-CM | POA: Diagnosis not present

## 2024-09-14 DIAGNOSIS — G43719 Chronic migraine without aura, intractable, without status migrainosus: Secondary | ICD-10-CM | POA: Diagnosis not present

## 2024-10-04 ENCOUNTER — Ambulatory Visit: Payer: Self-pay

## 2024-10-04 DIAGNOSIS — Z23 Encounter for immunization: Secondary | ICD-10-CM

## 2024-10-12 ENCOUNTER — Ambulatory Visit
Admission: RE | Admit: 2024-10-12 | Discharge: 2024-10-12 | Disposition: A | Discharge status: Home / Self Care | Source: Ambulatory Visit | Attending: Family Medicine | Admitting: Family Medicine

## 2024-10-12 DIAGNOSIS — G43719 Chronic migraine without aura, intractable, without status migrainosus: Secondary | ICD-10-CM | POA: Diagnosis not present

## 2024-10-12 DIAGNOSIS — Z1231 Encounter for screening mammogram for malignant neoplasm of breast: Secondary | ICD-10-CM | POA: Insufficient documentation

## 2024-10-12 DIAGNOSIS — M542 Cervicalgia: Secondary | ICD-10-CM | POA: Diagnosis not present

## 2024-10-15 ENCOUNTER — Ambulatory Visit: Payer: Self-pay | Admitting: Family Medicine

## 2024-11-04 DIAGNOSIS — M542 Cervicalgia: Secondary | ICD-10-CM | POA: Diagnosis not present

## 2024-11-04 DIAGNOSIS — G43719 Chronic migraine without aura, intractable, without status migrainosus: Secondary | ICD-10-CM | POA: Diagnosis not present

## 2024-12-27 ENCOUNTER — Ambulatory Visit: Payer: Self-pay

## 2024-12-27 NOTE — Telephone Encounter (Signed)
 FYI Only or Action Required?: FYI only for provider: appointment scheduled on 12/28/24.  Patient was last seen in primary care on 07/01/2024 by Cleatus Arlyss RAMAN, MD.  Called Nurse Triage reporting Cough.  Symptoms began several days ago.  Interventions attempted: Nothing.  Symptoms are: unchanged.  Triage Disposition: See Physician Within 24 Hours  Patient/caregiver understands and will follow disposition?: Yes   Copied from CRM #[id number]. Topic: Clinical - Red Word Triage >> Dec 27, 2024  9:13 AM Antwanette L wrote: Red Word that prompted transfer to Nurse Triage:  Patient reports fatigue, head congestion, upper chest and throat congestion, and ear fullness. Patient is coughing up a small amount of pale yellow mucus, which is clear today Reason for Disposition  [1] Continuous (nonstop) coughing interferes with work or school AND [2] no improvement using cough treatment per Care Advice  Answer Assessment - Initial Assessment Questions Scheduled 12/28/24 Advised call back or ED/911 if symptoms occur/worsen: severe diff breathing, chest pain > 5 min, faint. Patient verbalized understanding.   1. ONSET: When did the cough begin?      Week ago; cough congestion, ear fullness, chest congestion 2. SEVERITY: How bad is the cough today?      Mild to moderate; cough meds 3. SPUTUM: Describe the color of your sputum (e.g., none, dry cough; clear, white, yellow, green)     White to clear 4. HEMOPTYSIS: Are you coughing up any blood? If Yes, ask: How much? (e.g., flecks, streaks, tablespoons, etc.)     no 5. DIFFICULTY BREATHING: Are you having difficulty breathing? If Yes, ask: How bad is it? (e.g., mild, moderate, severe)      no 6. FEVER: Do you have a fever? If Yes, ask: What is your temperature, how was it measured, and when did it start?     Denies fever chills n/v/d 7. CARDIAC HISTORY: Do you have any history of heart disease? (e.g., heart attack, congestive heart  failure)      no 8. LUNG HISTORY: Do you have any history of lung disease?  (e.g., pulmonary embolus, asthma, emphysema)     no 9. PE RISK FACTORS: Do you have a history of blood clots? (or: recent major surgery, recent prolonged travel, bedridden)     no 10. OTHER SYMPTOMS: Do you have any other symptoms? (e.g., runny nose, wheezing, chest pain)    Sore throat   Denies diff breathing, chest pain, wheezing, no painful swallowing  12. TRAVEL: Have you traveled out of the country in the last month? (e.g., travel history, exposures)       no  Protocols used: Cough - Acute Productive-A-AH

## 2024-12-27 NOTE — Telephone Encounter (Signed)
 Noted, thanks!

## 2024-12-28 ENCOUNTER — Ambulatory Visit: Admitting: Family Medicine

## 2025-01-03 ENCOUNTER — Ambulatory Visit: Admitting: Family Medicine

## 2025-01-18 ENCOUNTER — Ambulatory Visit: Admitting: Family Medicine
